# Patient Record
Sex: Female | Born: 1945 | Race: White | Hispanic: No | Marital: Married | Smoking: Former smoker
Health system: Southern US, Community
[De-identification: ages and names within clinical notes are randomized; demographics above are authoritative.]

## PROBLEM LIST (undated history)

## (undated) DIAGNOSIS — I2510 Atherosclerotic heart disease of native coronary artery without angina pectoris: Secondary | ICD-10-CM

## (undated) DIAGNOSIS — G629 Polyneuropathy, unspecified: Secondary | ICD-10-CM

## (undated) DIAGNOSIS — R296 Repeated falls: Secondary | ICD-10-CM

## (undated) DIAGNOSIS — D649 Anemia, unspecified: Secondary | ICD-10-CM

## (undated) DIAGNOSIS — E785 Hyperlipidemia, unspecified: Secondary | ICD-10-CM

## (undated) DIAGNOSIS — E119 Type 2 diabetes mellitus without complications: Secondary | ICD-10-CM

## (undated) DIAGNOSIS — I10 Essential (primary) hypertension: Secondary | ICD-10-CM

## (undated) DIAGNOSIS — E669 Obesity, unspecified: Secondary | ICD-10-CM

## (undated) DIAGNOSIS — W19XXXA Unspecified fall, initial encounter: Secondary | ICD-10-CM

## (undated) DIAGNOSIS — N189 Chronic kidney disease, unspecified: Secondary | ICD-10-CM

## (undated) HISTORY — PX: CARDIAC CATHETERIZATION: SHX172

---

## 2017-03-30 DIAGNOSIS — E119 Type 2 diabetes mellitus without complications: Secondary | ICD-10-CM | POA: Insufficient documentation

## 2017-03-30 DIAGNOSIS — Z794 Long term (current) use of insulin: Secondary | ICD-10-CM | POA: Insufficient documentation

## 2018-11-01 DIAGNOSIS — E1140 Type 2 diabetes mellitus with diabetic neuropathy, unspecified: Secondary | ICD-10-CM | POA: Insufficient documentation

## 2018-11-01 DIAGNOSIS — Z955 Presence of coronary angioplasty implant and graft: Secondary | ICD-10-CM | POA: Insufficient documentation

## 2018-11-01 DIAGNOSIS — I1310 Hypertensive heart and chronic kidney disease without heart failure, with stage 1 through stage 4 chronic kidney disease, or unspecified chronic kidney disease: Secondary | ICD-10-CM | POA: Insufficient documentation

## 2018-11-01 DIAGNOSIS — I739 Peripheral vascular disease, unspecified: Secondary | ICD-10-CM | POA: Insufficient documentation

## 2018-12-15 DIAGNOSIS — E782 Mixed hyperlipidemia: Secondary | ICD-10-CM | POA: Insufficient documentation

## 2020-12-26 ENCOUNTER — Emergency Department: Payer: Medicare Other

## 2020-12-26 ENCOUNTER — Observation Stay (HOSPITAL_COMMUNITY)
Admit: 2020-12-26 | Discharge: 2020-12-26 | Disposition: A | Discharge status: Home / Self Care | Payer: Medicare Other | Attending: Physician Assistant | Admitting: Physician Assistant

## 2020-12-26 ENCOUNTER — Other Ambulatory Visit: Payer: Self-pay

## 2020-12-26 ENCOUNTER — Inpatient Hospital Stay
Admission: EM | Admit: 2020-12-26 | Discharge: 2020-12-29 | DRG: 287 | Disposition: A | Discharge status: Home / Self Care | Payer: Medicare Other | Attending: Internal Medicine | Admitting: Internal Medicine

## 2020-12-26 ENCOUNTER — Encounter: Payer: Self-pay | Admitting: Internal Medicine

## 2020-12-26 DIAGNOSIS — Z803 Family history of malignant neoplasm of breast: Secondary | ICD-10-CM

## 2020-12-26 DIAGNOSIS — Z79899 Other long term (current) drug therapy: Secondary | ICD-10-CM

## 2020-12-26 DIAGNOSIS — I480 Paroxysmal atrial fibrillation: Secondary | ICD-10-CM | POA: Diagnosis not present

## 2020-12-26 DIAGNOSIS — I129 Hypertensive chronic kidney disease with stage 1 through stage 4 chronic kidney disease, or unspecified chronic kidney disease: Secondary | ICD-10-CM | POA: Diagnosis present

## 2020-12-26 DIAGNOSIS — I10 Essential (primary) hypertension: Secondary | ICD-10-CM | POA: Diagnosis not present

## 2020-12-26 DIAGNOSIS — R079 Chest pain, unspecified: Secondary | ICD-10-CM | POA: Diagnosis not present

## 2020-12-26 DIAGNOSIS — Z955 Presence of coronary angioplasty implant and graft: Secondary | ICD-10-CM

## 2020-12-26 DIAGNOSIS — I4891 Unspecified atrial fibrillation: Secondary | ICD-10-CM | POA: Diagnosis not present

## 2020-12-26 DIAGNOSIS — E1140 Type 2 diabetes mellitus with diabetic neuropathy, unspecified: Secondary | ICD-10-CM

## 2020-12-26 DIAGNOSIS — Z823 Family history of stroke: Secondary | ICD-10-CM

## 2020-12-26 DIAGNOSIS — Z20822 Contact with and (suspected) exposure to covid-19: Secondary | ICD-10-CM | POA: Diagnosis present

## 2020-12-26 DIAGNOSIS — D649 Anemia, unspecified: Secondary | ICD-10-CM

## 2020-12-26 DIAGNOSIS — Z885 Allergy status to narcotic agent status: Secondary | ICD-10-CM

## 2020-12-26 DIAGNOSIS — I4510 Unspecified right bundle-branch block: Secondary | ICD-10-CM | POA: Diagnosis present

## 2020-12-26 DIAGNOSIS — Z888 Allergy status to other drugs, medicaments and biological substances status: Secondary | ICD-10-CM

## 2020-12-26 DIAGNOSIS — I214 Non-ST elevation (NSTEMI) myocardial infarction: Secondary | ICD-10-CM | POA: Diagnosis present

## 2020-12-26 DIAGNOSIS — E1122 Type 2 diabetes mellitus with diabetic chronic kidney disease: Secondary | ICD-10-CM | POA: Diagnosis present

## 2020-12-26 DIAGNOSIS — R778 Other specified abnormalities of plasma proteins: Secondary | ICD-10-CM | POA: Diagnosis not present

## 2020-12-26 DIAGNOSIS — Z820 Family history of epilepsy and other diseases of the nervous system: Secondary | ICD-10-CM

## 2020-12-26 DIAGNOSIS — G4730 Sleep apnea, unspecified: Secondary | ICD-10-CM | POA: Diagnosis present

## 2020-12-26 DIAGNOSIS — I2510 Atherosclerotic heart disease of native coronary artery without angina pectoris: Secondary | ICD-10-CM | POA: Diagnosis present

## 2020-12-26 DIAGNOSIS — Z8049 Family history of malignant neoplasm of other genital organs: Secondary | ICD-10-CM

## 2020-12-26 DIAGNOSIS — I2584 Coronary atherosclerosis due to calcified coronary lesion: Secondary | ICD-10-CM | POA: Diagnosis present

## 2020-12-26 DIAGNOSIS — Z6841 Body Mass Index (BMI) 40.0 and over, adult: Secondary | ICD-10-CM

## 2020-12-26 DIAGNOSIS — E669 Obesity, unspecified: Secondary | ICD-10-CM

## 2020-12-26 DIAGNOSIS — N183 Chronic kidney disease, stage 3 unspecified: Secondary | ICD-10-CM | POA: Diagnosis present

## 2020-12-26 DIAGNOSIS — E1142 Type 2 diabetes mellitus with diabetic polyneuropathy: Secondary | ICD-10-CM | POA: Diagnosis present

## 2020-12-26 DIAGNOSIS — E785 Hyperlipidemia, unspecified: Secondary | ICD-10-CM

## 2020-12-26 DIAGNOSIS — D631 Anemia in chronic kidney disease: Secondary | ICD-10-CM | POA: Diagnosis present

## 2020-12-26 DIAGNOSIS — Z8249 Family history of ischemic heart disease and other diseases of the circulatory system: Secondary | ICD-10-CM

## 2020-12-26 DIAGNOSIS — F419 Anxiety disorder, unspecified: Secondary | ICD-10-CM | POA: Diagnosis present

## 2020-12-26 DIAGNOSIS — I252 Old myocardial infarction: Secondary | ICD-10-CM

## 2020-12-26 DIAGNOSIS — Z87891 Personal history of nicotine dependence: Secondary | ICD-10-CM

## 2020-12-26 DIAGNOSIS — I248 Other forms of acute ischemic heart disease: Secondary | ICD-10-CM | POA: Diagnosis present

## 2020-12-26 HISTORY — DX: Hyperlipidemia, unspecified: E78.5

## 2020-12-26 HISTORY — DX: Chronic kidney disease, unspecified: N18.9

## 2020-12-26 HISTORY — DX: Obesity, unspecified: E66.9

## 2020-12-26 HISTORY — DX: Anemia, unspecified: D64.9

## 2020-12-26 HISTORY — DX: Essential (primary) hypertension: I10

## 2020-12-26 HISTORY — DX: Atherosclerotic heart disease of native coronary artery without angina pectoris: I25.10

## 2020-12-26 HISTORY — DX: Repeated falls: R29.6

## 2020-12-26 HISTORY — DX: Polyneuropathy, unspecified: G62.9

## 2020-12-26 HISTORY — DX: Type 2 diabetes mellitus without complications: E11.9

## 2020-12-26 HISTORY — DX: Unspecified fall, initial encounter: W19.XXXA

## 2020-12-26 LAB — CBC
HCT: 32.8 % — ABNORMAL LOW (ref 36.0–46.0)
Hemoglobin: 11.1 g/dL — ABNORMAL LOW (ref 12.0–15.0)
MCH: 31.4 pg (ref 26.0–34.0)
MCHC: 33.8 g/dL (ref 30.0–36.0)
MCV: 92.9 fL (ref 80.0–100.0)
Platelets: 229 10*3/uL (ref 150–400)
RBC: 3.53 MIL/uL — ABNORMAL LOW (ref 3.87–5.11)
RDW: 13.9 % (ref 11.5–15.5)
WBC: 8.8 10*3/uL (ref 4.0–10.5)
nRBC: 0 % (ref 0.0–0.2)

## 2020-12-26 LAB — COMPREHENSIVE METABOLIC PANEL
ALT: 15 U/L (ref 0–44)
AST: 26 U/L (ref 15–41)
Albumin: 3.8 g/dL (ref 3.5–5.0)
Alkaline Phosphatase: 74 U/L (ref 38–126)
Anion gap: 12 (ref 5–15)
BUN: 51 mg/dL — ABNORMAL HIGH (ref 8–23)
CO2: 21 mmol/L — ABNORMAL LOW (ref 22–32)
Calcium: 9.3 mg/dL (ref 8.9–10.3)
Chloride: 107 mmol/L (ref 98–111)
Creatinine, Ser: 1.65 mg/dL — ABNORMAL HIGH (ref 0.44–1.00)
GFR, Estimated: 32 mL/min — ABNORMAL LOW (ref >60)
Glucose, Bld: 195 mg/dL — ABNORMAL HIGH (ref 70–99)
Potassium: 4.1 mmol/L (ref 3.5–5.1)
Sodium: 140 mmol/L (ref 135–145)
Total Bilirubin: 0.7 mg/dL (ref 0.3–1.2)
Total Protein: 7.6 g/dL (ref 6.5–8.1)

## 2020-12-26 LAB — ECHOCARDIOGRAM COMPLETE
AR max vel: 2.08 cm2
AV Area VTI: 2.03 cm2
AV Area mean vel: 1.86 cm2
AV Mean grad: 5 mmHg
AV Peak grad: 8.8 mmHg
Ao pk vel: 1.49 m/s
Area-P 1/2: 5.58 cm2
Height: 63 in
S' Lateral: 3.39 cm
Weight: 3840 oz

## 2020-12-26 LAB — TSH: TSH: 1.571 u[IU]/mL (ref 0.350–4.500)

## 2020-12-26 LAB — RESP PANEL BY RT-PCR (FLU A&B, COVID) ARPGX2
Influenza A by PCR: NEGATIVE
Influenza B by PCR: NEGATIVE
SARS Coronavirus 2 by RT PCR: NEGATIVE

## 2020-12-26 LAB — APTT: aPTT: 29 seconds (ref 24–36)

## 2020-12-26 LAB — TROPONIN I (HIGH SENSITIVITY)
Troponin I (High Sensitivity): 24 ng/L — ABNORMAL HIGH (ref <18)
Troponin I (High Sensitivity): 420 ng/L (ref <18)
Troponin I (High Sensitivity): 1311 ng/L (ref <18)
Troponin I (High Sensitivity): 2199 ng/L (ref <18)

## 2020-12-26 LAB — HEPARIN LEVEL (UNFRACTIONATED): Heparin Unfractionated: <0.1 IU/mL — ABNORMAL LOW (ref 0.30–0.70)

## 2020-12-26 LAB — HEMOGLOBIN A1C
Hgb A1c MFr Bld: 6.7 % — ABNORMAL HIGH (ref 4.8–5.6)
Mean Plasma Glucose: 145.59 mg/dL

## 2020-12-26 LAB — MAGNESIUM: Magnesium: 2 mg/dL (ref 1.7–2.4)

## 2020-12-26 LAB — PROTIME-INR
INR: 1 (ref 0.8–1.2)
Prothrombin Time: 13 seconds (ref 11.4–15.2)

## 2020-12-26 LAB — CBG MONITORING, ED
Glucose-Capillary: 143 mg/dL — ABNORMAL HIGH (ref 70–99)
Glucose-Capillary: 104 mg/dL — ABNORMAL HIGH (ref 70–99)

## 2020-12-26 LAB — GLUCOSE, CAPILLARY: Glucose-Capillary: 136 mg/dL — ABNORMAL HIGH (ref 70–99)

## 2020-12-26 LAB — T4, FREE: Free T4: 0.9 ng/dL (ref 0.61–1.12)

## 2020-12-26 MED ORDER — ATORVASTATIN CALCIUM 20 MG PO TABS: 40.0000 mg | ORAL_TABLET | Freq: Every day | ORAL | Status: DC

## 2020-12-26 MED ORDER — DILTIAZEM HCL 25 MG/5ML IV SOLN
15.0000 mg | Freq: Once | INTRAVENOUS | Status: AC
  Administered 2020-12-26: 15 mg via INTRAVENOUS
  Filled 2020-12-26: qty 5

## 2020-12-26 MED ORDER — ASPIRIN 81 MG PO CHEW: 324.0000 mg | CHEWABLE_TABLET | ORAL | Status: AC

## 2020-12-26 MED ORDER — SODIUM CHLORIDE 0.9% FLUSH: 3.0000 mL | INTRAVENOUS | Status: DC | PRN

## 2020-12-26 MED ORDER — HEPARIN BOLUS VIA INFUSION
2300.0000 [IU] | Freq: Once | INTRAVENOUS | Status: AC
  Administered 2020-12-26: 2300 [IU] via INTRAVENOUS
  Filled 2020-12-26: qty 2300

## 2020-12-26 MED ORDER — DILTIAZEM HCL-DEXTROSE 125-5 MG/125ML-% IV SOLN (PREMIX): 5.0000 mg/h | INTRAVENOUS | Status: DC

## 2020-12-26 MED ORDER — CLOPIDOGREL BISULFATE 75 MG PO TABS
75.0000 mg | ORAL_TABLET | Freq: Every day | ORAL | Status: DC
  Administered 2020-12-27 – 2020-12-29 (×3): 75 mg via ORAL
  Filled 2020-12-26 (×3): qty 1

## 2020-12-26 MED ORDER — ACETAMINOPHEN 325 MG PO TABS
650.0000 mg | ORAL_TABLET | ORAL | Status: DC | PRN
  Administered 2020-12-27 – 2020-12-29 (×2): 650 mg via ORAL
  Filled 2020-12-26 (×2): qty 2

## 2020-12-26 MED ORDER — ASPIRIN 300 MG RE SUPP: 300.0000 mg | RECTAL | Status: AC

## 2020-12-26 MED ORDER — ASPIRIN 81 MG PO CHEW: 324.0000 mg | CHEWABLE_TABLET | Freq: Once | ORAL | Status: DC

## 2020-12-26 MED ORDER — METOPROLOL TARTRATE 25 MG PO TABS: 25.0000 mg | ORAL_TABLET | Freq: Two times a day (BID) | ORAL | Status: DC

## 2020-12-26 MED ORDER — SODIUM CHLORIDE 0.9 % IV SOLN: 250.0000 mL | INTRAVENOUS | Status: DC | PRN

## 2020-12-26 MED ORDER — SODIUM CHLORIDE 0.9 % IV BOLUS
1000.0000 mL | Freq: Once | INTRAVENOUS | Status: AC
  Administered 2020-12-26: 1000 mL via INTRAVENOUS

## 2020-12-26 MED ORDER — ASPIRIN 81 MG PO CHEW
81.0000 mg | CHEWABLE_TABLET | Freq: Once | ORAL | Status: AC
  Administered 2020-12-26: 81 mg via ORAL
  Filled 2020-12-26: qty 1

## 2020-12-26 MED ORDER — LOSARTAN POTASSIUM 50 MG PO TABS
50.0000 mg | ORAL_TABLET | Freq: Every day | ORAL | Status: DC
  Administered 2020-12-27 – 2020-12-29 (×3): 50 mg via ORAL
  Filled 2020-12-26 (×3): qty 1

## 2020-12-26 MED ORDER — METOPROLOL SUCCINATE ER 50 MG PO TB24
50.0000 mg | ORAL_TABLET | Freq: Every day | ORAL | Status: DC
  Administered 2020-12-27 – 2020-12-28 (×2): 50 mg via ORAL
  Filled 2020-12-26 (×2): qty 1

## 2020-12-26 MED ORDER — ASPIRIN EC 81 MG PO TBEC
81.0000 mg | DELAYED_RELEASE_TABLET | Freq: Every day | ORAL | Status: DC
  Administered 2020-12-27 – 2020-12-29 (×3): 81 mg via ORAL
  Filled 2020-12-26 (×3): qty 1

## 2020-12-26 MED ORDER — AMLODIPINE BESYLATE 5 MG PO TABS
5.0000 mg | ORAL_TABLET | Freq: Every day | ORAL | Status: DC
  Administered 2020-12-27: 5 mg via ORAL
  Filled 2020-12-26 (×2): qty 1

## 2020-12-26 MED ORDER — NITROGLYCERIN 0.4 MG SL SUBL
0.4000 mg | SUBLINGUAL_TABLET | SUBLINGUAL | Status: DC | PRN
Start: 2020-12-26 — End: 2020-12-26

## 2020-12-26 MED ORDER — SODIUM CHLORIDE 0.9% FLUSH
3.0000 mL | Freq: Two times a day (BID) | INTRAVENOUS | Status: DC
  Administered 2020-12-26 – 2020-12-28 (×5): 3 mL via INTRAVENOUS

## 2020-12-26 MED ORDER — INSULIN ASPART 100 UNIT/ML IJ SOLN
0.0000 [IU] | Freq: Three times a day (TID) | INTRAMUSCULAR | Status: DC
Start: 2020-12-26 — End: 2020-12-29
  Administered 2020-12-27: 3 [IU] via SUBCUTANEOUS
  Administered 2020-12-28: 4 [IU] via SUBCUTANEOUS
  Administered 2020-12-28: 3 [IU] via SUBCUTANEOUS
  Administered 2020-12-29 (×2): 4 [IU] via SUBCUTANEOUS
  Filled 2020-12-26 (×5): qty 1

## 2020-12-26 MED ORDER — FUROSEMIDE 40 MG PO TABS
40.0000 mg | ORAL_TABLET | Freq: Every day | ORAL | Status: DC
  Administered 2020-12-27: 40 mg via ORAL
  Filled 2020-12-26: qty 1

## 2020-12-26 MED ORDER — NITROGLYCERIN 0.4 MG SL SUBL: 0.4000 mg | SUBLINGUAL_TABLET | SUBLINGUAL | Status: DC | PRN

## 2020-12-26 MED ORDER — ONDANSETRON HCL 4 MG/2ML IJ SOLN: 4.0000 mg | Freq: Four times a day (QID) | INTRAMUSCULAR | Status: DC | PRN

## 2020-12-26 MED ORDER — HEPARIN BOLUS VIA INFUSION
4000.0000 [IU] | Freq: Once | INTRAVENOUS | Status: AC
  Administered 2020-12-26: 4000 [IU] via INTRAVENOUS
  Filled 2020-12-26: qty 4000

## 2020-12-26 MED ORDER — HEPARIN (PORCINE) 25000 UT/250ML-% IV SOLN
1700.0000 [IU]/h | INTRAVENOUS | Status: DC
  Administered 2020-12-26: 950 [IU]/h via INTRAVENOUS
  Administered 2020-12-27: 1700 [IU]/h via INTRAVENOUS
  Administered 2020-12-27: 1400 [IU]/h via INTRAVENOUS
  Administered 2020-12-28 – 2020-12-29 (×2): 1700 [IU]/h via INTRAVENOUS
  Filled 2020-12-26 (×5): qty 250

## 2020-12-26 MED ORDER — NITROGLYCERIN 0.2 MG/HR TD PT24
0.2000 mg | MEDICATED_PATCH | Freq: Every day | TRANSDERMAL | Status: DC
  Administered 2020-12-26 – 2020-12-29 (×4): 0.2 mg via TRANSDERMAL
  Filled 2020-12-26 (×4): qty 1

## 2020-12-26 NOTE — ED Triage Notes (Signed)
Pt in with co chest tightness and burning that started at 0000. States woke her up, took 81 mg ASA x 3 and 4 nitro since. States had MI 20 years ago. States nitro did decrease pain.

## 2020-12-26 NOTE — ED Notes (Signed)
Assisted Pt to toilet.   Echo Tech at bedside.

## 2020-12-26 NOTE — ED Notes (Signed)
ED Provider at bedside. 

## 2020-12-26 NOTE — ED Notes (Signed)
Report to Crystal, RN.

## 2020-12-26 NOTE — Progress Notes (Signed)
*  PRELIMINARY RESULTS* Echocardiogram 2D Echocardiogram has been performed.  Hannah Thomas 12/26/2020, 2:21 PM 

## 2020-12-26 NOTE — Progress Notes (Signed)
   12/26/20 1610  Clinical Encounter Type  Visited With Family  Visit Type Initial;Social support;Spiritual support  Spiritual Encounters  Spiritual Needs Other (Comment) (social support)  Luna Fuse engaged Pt's spouse in hallway. He appeared to be rather restless but was not anxious. Chaplain Burris offered reflective listening and social support, encouraging spouse in his self-care. Son arrived. Spouse informed chaplain that Pt would have a heart cath procedure and Luna Fuse stated that she would plan to follow-up with them on Monday, 8/8.

## 2020-12-26 NOTE — ED Notes (Signed)
Placed pt in hospital bed and she went to the toilet to urinate during change over.

## 2020-12-26 NOTE — ED Notes (Signed)
Pharmacy at bedside

## 2020-12-26 NOTE — ED Notes (Signed)
PA from cardiology at bedside.

## 2020-12-26 NOTE — ED Notes (Signed)
xt at bedside.

## 2020-12-26 NOTE — Plan of Care (Signed)

## 2020-12-26 NOTE — Consult Note (Signed)
ANTICOAGULATION CONSULT NOTE - Initial Consult  Pharmacy Consult for heparin initiation  Indication: chest pain/ACS and new onset AFib  Allergies  Allergen Reactions   Erythromycin Other (See Comments)   Morphine And Related Rash    Patient Measurements: Height: 5\' 3"  (160 cm) Weight: 108.9 kg (240 lb) IBW/kg (Calculated) : 52.4 Heparin Dosing Weight: 78.5kg  Vital Signs: Temp: 98.4 F (36.9 C) (08/05 0619) Temp Source: Oral (08/05 0619) BP: 161/64 (08/05 0830) Pulse Rate: 83 (08/05 0830)  Labs: Recent Labs    12/26/20 0634 12/26/20 0826  HGB 11.1*  --   HCT 32.8*  --   PLT 229  --   CREATININE 1.65*  --   TROPONINIHS 24* 420*    Estimated Creatinine Clearance: 33.8 mL/min (A) (by C-G formula based on SCr of 1.65 mg/dL (H)).   Medical History: No past medical history on file.  Medications:  (Not in a hospital admission)  Scheduled:   heparin  4,000 Units Intravenous Once   Infusions:   heparin     PRN:  Anti-infectives (From admission, onward)    None       Assessment: Ms. Bauer is a 75yo female presenting with chest pain (Troponin trend: 24>420>1311), new onset atrial fibrilation (CHA2DS2-VASc=6), and rapid ventricular rate. Cardiology is following. Pharmacy was consulted for heparin initiation for ACS and new onset AFib.  After reviewing the baseline CBC, no contraindications for heparin are present.   The PTA medication list does not include a DOAC.  Goal of Therapy:  Heparin level 0.3-0.7 units/ml aPTT 66-102 seconds Monitor platelets by anticoagulation protocol: Yes   Plan:  Give 4000 units bolus x 1 Start heparin infusion at 1,100 units/hr Check anti-Xa level in 8 hours and daily while on heparin Continue to monitor H&H and platelets  03-12-1979, PharmD Pharmacy Resident  12/26/2020 9:44 AM

## 2020-12-26 NOTE — H&P (Addendum)
History and Physical    Hannah Thomas GBT:[id number] DOB: [birth year] DOA: 12/26/2020  PCP: Pcp, No   Patient coming from: Home  I have personally briefly reviewed patient's old medical records in [facility]  Chief Complaint: Chest pain  HPI: Hannah Thomas is a 75 y.o. female with medical history significant for coronary artery disease status post PCI with stent angioplasty, hypertension, diabetes mellitus, dyslipidemia who presents to the emergency room for evaluation of chest pain. She presented for evaluation of midsternal chest pain that she described as pressure-like and a burning sensation that woke her up at about midnight.  She took 2 nitroglycerin pills with improvement in her symptoms and was able to go back to bed.  She awoke again at 5 AM with worsening chest pain and took 4 nitroglycerin tablets 5 minutes apart without any significant improvement in her symptoms.  Chest pain was nonradiating and was associated with diaphoresis and palpitations.  She denied having any nausea, no vomiting or shortness of breath.  Due to the persistence of her symptoms she decided to come to the emergency room to get checked. At the time of my evaluation she is chest pain-free. She denies having any cough, no headache, no fever, no chills, no abdominal pain, no urinary symptoms, no lower extremity swelling, no dizziness or lightheadedness. Labs show sodium 140, potassium 4.1, chloride 107, bicarb 21, glucose 195, BUN 51, creatinine 1.65, calcium 9.3, magnesium 2.0, alkaline phosphatase 74, albumin 3.8, AST 26, ALT 15, total protein 7.6, troponin 24 >> 420, white count 8.8, hemoglobin 11.1, hematocrit 32.8, MCV 92.9, RDW 13.9, platelet count 229, PT 13.0, INR 1.0, TSH 1.57 Chest x-ray reviewed by me shows low lung volumes without evidence of acute cardiopulmonary disease. Respiratory viral panel is negative Twelve-lead EKG showed rapid atrial fibrillation with a right bundle branch block  ED Course: Patient  is a 75 year old female with a history of coronary artery disease status post stent angioplasty.  Last stent was placed in March, 2022.  She presents to the ER for evaluation of midsternal chest pain similar to her prior history of heart attacks and was found to be in rapid A. fib. She does not have a known history of atrial fibrillation. She received a dose of Cardizem 15 mg IV once and prior to initiation of Cardizem drip she converted to sinus rhythm. Her initial troponin was 24 and second troponin resulted at 420. She will be referred to observation status for further evaluation.  Review of Systems: As per HPI otherwise all other systems reviewed and negative.    Past Medical History:  Diagnosis Date   Anemia    CAD in native artery    a. MI ~ 2000 s/p PCI x 2; b. PCI ~ 2015; c. PCI ~ 2020 complicated vy contrast-induced nephropathy; d. PCI 07/2020   CKD (chronic kidney disease)    DM2 (diabetes mellitus, type 2) (HCC)    Essential hypertension    Falls    Hyperlipidemia LDL goal <70    Obesity    Peripheral neuropathy     Past Surgical History:  Procedure Laterality Date   CARDIAC CATHETERIZATION       reports that she quit smoking about 45 years ago. Her smoking use included cigarettes. She started smoking about 57 years ago. She has never used smokeless tobacco. She reports previous alcohol use. No history on file for drug use.  Allergies  Allergen Reactions   Erythromycin Other (See Comments)   Morphine And Related  Rash    Family History  Problem Relation Age of Onset   Breast cancer Mother    CAD Mother    Hypertension Mother    Migraines Mother    Stroke Mother    Dementia Father    Parkinson's disease Father    Endometrial cancer Sister       Prior to Admission medications   Not on File    Physical Exam: Vitals:   12/26/20 0830 12/26/20 0900 12/26/20 1000 12/26/20 1030  BP: (!) 161/64 (!) 161/53 (!) 182/67 (!) 157/50  Pulse: 83 81 84 91  Resp: (!)  29 (!) 21 18 12   Temp:      TempSrc:      SpO2: 97% 98% 97% 97%  Weight:      Height:         Vitals:   12/26/20 0830 12/26/20 0900 12/26/20 1000 12/26/20 1030  BP: (!) 161/64 (!) 161/53 (!) 182/67 (!) 157/50  Pulse: 83 81 84 91  Resp: (!) 29 (!) 21 18 12   Temp:      TempSrc:      SpO2: 97% 98% 97% 97%  Weight:      Height:          Constitutional: Alert and oriented x 3 . Not in any apparent distress HEENT:      Head: Normocephalic and atraumatic.         Eyes: PERLA, EOMI, Conjunctivae are normal. Sclera is non-icteric.       Mouth/Throat: Mucous membranes are moist.       Neck: Supple with no signs of meningismus. Cardiovascular: Regular rate and rhythm. No murmurs, gallops, or rubs. 2+ symmetrical distal pulses are present . No JVD. Trace LE edema Respiratory: Respiratory effort normal .Lungs sounds clear bilaterally. No wheezes, crackles, or rhonchi.  Gastrointestinal: Soft, non tender, and non distended with positive bowel sounds.  Genitourinary: No CVA tenderness. Musculoskeletal: Nontender with normal range of motion in all extremities. No cyanosis, or erythema of extremities. Neurologic:  Face is symmetric. Moving all extremities. No gross focal neurologic deficits . Skin: Skin is warm, dry.  No rash or ulcers Psychiatric: Mood and affect are normal    Labs on Admission: I have personally reviewed following labs and imaging studies  CBC: Recent Labs  Lab 12/26/20 0634  WBC 8.8  HGB 11.1*  HCT 32.8*  MCV 92.9  PLT 229   Basic Metabolic Panel: Recent Labs  Lab 12/26/20 0634 12/26/20 0826  NA 140  --   K 4.1  --   CL 107  --   CO2 21*  --   GLUCOSE 195*  --   BUN 51*  --   CREATININE 1.65*  --   CALCIUM 9.3  --   MG  --  2.0   GFR: Estimated Creatinine Clearance: 33.8 mL/min (A) (by C-G formula based on SCr of 1.65 mg/dL (H)). Liver Function Tests: Recent Labs  Lab 12/26/20 0634  AST 26  ALT 15  ALKPHOS 74  BILITOT 0.7  PROT 7.6   ALBUMIN 3.8   No results for input(s): LIPASE, AMYLASE in the last 168 hours. No results for input(s): AMMONIA in the last 168 hours. Coagulation Profile: Recent Labs  Lab 12/26/20 0922  INR 1.0   Cardiac Enzymes: No results for input(s): CKTOTAL, CKMB, CKMBINDEX, TROPONINI in the last 168 hours. BNP (last 3 results) No results for input(s): PROBNP in the last 8760 hours. HbA1C: No results for input(s): HGBA1C in the  last 72 hours. CBG: No results for input(s): GLUCAP in the last 168 hours. Lipid Profile: No results for input(s): CHOL, HDL, LDLCALC, TRIG, CHOLHDL, LDLDIRECT in the last 72 hours. Thyroid Function Tests: Recent Labs    12/26/20 0634  TSH 1.571  FREET4 0.90   Anemia Panel: No results for input(s): VITAMINB12, FOLATE, FERRITIN, TIBC, IRON, RETICCTPCT in the last 72 hours. Urine analysis: No results found for: COLORURINE, APPEARANCEUR, LABSPEC, PHURINE, GLUCOSEU, HGBUR, BILIRUBINUR, KETONESUR, PROTEINUR, UROBILINOGEN, NITRITE, LEUKOCYTESUR  Radiological Exams on Admission: DG Chest Port 1 View  Result Date: 12/26/2020 CLINICAL DATA:  75 year old female with history of chest tightness and burning. EXAM: PORTABLE CHEST 1 VIEW COMPARISON:  No priors. FINDINGS: Lung volumes are slightly low. Linear areas of scarring or subsegmental atelectasis are noted throughout the left mid to lower lung. No acute consolidative airspace disease. No pleural effusions. No pneumothorax. No evidence of pulmonary edema. Heart size is borderline enlarged accentuated by low lung volumes and portable AP technique. Upper mediastinal contours are within normal limits. Atherosclerotic calcifications in the arch of the aorta. IMPRESSION: 1. Low lung volumes without definite radiographic evidence of acute cardiopulmonary disease. 2. Mild linear scarring or subsegmental atelectasis in the left mid to lower lung. 3. Aortic atherosclerosis. Electronically Signed   By: Trudie Reed M.D.   On:  12/26/2020 07:04     Assessment/Plan Principal Problem:   Atrial fibrillation with rapid ventricular response (HCC) Active Problems:   CAD in native artery   CKD stage 3 due to type 2 diabetes mellitus (HCC)   Obesity   Essential hypertension   NSTEMI (non-ST elevated myocardial infarction) (HCC)      Atrial fibrillation with rapid ventricular response (Paroxysmal) Patient presented to the ER for evaluation of chest pain and was found to be in rapid atrial fibrillation. Received a dose of Cardizem 15 mg IV x1 and converted back to sinus rhythm Her CHA2DS2-VASc score is 5 and ideally she requires anticoagulation as primary prophylaxis for an acute stroke. Will start patient on a heparin drip Continue beta-blockers for rate control Obtain 2D echocardiogram to assess LVEF and rule out valvular pathology    Non-ST elevation MI Patient has a history of coronary artery disease and is status post stent angioplasty She presents to the ER for evaluation of chest pain that woke her up out of her sleep with minimal response to nitroglycerin and similar to her prior episodes of heart attack. She bumped her troponin from 24 to 420 Increase in troponin may also be from demand ischemia related to tachycardia versus from known coronary artery disease Continue heparin drip Continue aspirin, Plavix, statins and beta-blockers Obtain 2D echocardiogram to assess LVEF and rule out regional wall motion abnormality Cycle serial cardiac enzymes Consult cardiology    Diabetes mellitus with complications of stage III chronic kidney disease Place patient on a consistent carbohydrate diet Glycemic control with sliding scale insulin    Hypertension Continue metoprolol, amlodipine and Cozaar    Morbid obesity (BMI 42) Complicates overall prognosis and care    DVT prophylaxis: Heparin Code Status: full code  Family Communication: Greater than 50% of time was spent discussing patient's  condition and plan of care with her at the bedside.  All questions and concerns have been addressed.  She verbalizes understanding and agrees with the plan. Disposition Plan: Back to previous home environment Consults called: Cardiology Status: Observation    Charlee Squibb MD [facility]     12/26/2020, 11:00 AM

## 2020-12-26 NOTE — ED Notes (Signed)
Assisted pt to bathroom with steady gait, pt had large bowel movement. Night time home meds reviewed and not ordered, pt taking own night meds (60units lantus, 1000mg  metformin, 5mg  amlodipine, 50mg  metoprolol)

## 2020-12-26 NOTE — ED Provider Notes (Signed)
[facility] Emergency Department Provider Note   ____________________________________________   Event Date/Time   First MD Initiated Contact with Patient 12/26/20 0630     (approximate)  I have reviewed the triage vital signs and the nursing notes.   HISTORY  Chief Complaint Chest Pain    HPI Hannah Thomas is a 75 y.o. female who presents to the ED from home with a chief complaint of chest pain.  States chest tightness and burning woke her up after midnight.  Patient took 3 baby aspirin and 4 nitroglycerin.  Remote history of MI.  Reports partial relief and chest tightness, some shortness of breath.  Denies recent fevers, cough, abdominal pain, nausea, vomiting or dizziness.  Patient is vaccinated and boosted against COVID-19.  Splits her time between [place] and [place]; putting her house up for sale so has been under extra stress recently.  She is supposed to show her house today.     Past medical history CAD  There are no problems to display for this patient.   Prior to Admission medications   Not on File    Allergies Erythromycin and Morphine and related  No family history on file.  Social History Denies tobacco  Review of Systems  Constitutional: No fever/chills Eyes: No visual changes. ENT: No sore throat. Cardiovascular: Positive for chest pain. Respiratory: Positive for shortness of breath. Gastrointestinal: No abdominal pain.  No nausea, no vomiting.  No diarrhea.  No constipation. Genitourinary: Negative for dysuria. Musculoskeletal: Negative for back pain. Skin: Negative for rash. Neurological: Negative for headaches, focal weakness or numbness.   ____________________________________________   PHYSICAL EXAM:  VITAL SIGNS: ED Triage Vitals  Enc Vitals Group     BP 12/26/20 0619 (!) 161/148     Pulse Rate 12/26/20 0619 (!) 142     Resp 12/26/20 0619 18     Temp 12/26/20 0619 98.4 F (36.9 C)     Temp Source 12/26/20  0619 Oral     SpO2 12/26/20 0619 99 %     Weight 12/26/20 0616 240 lb (108.9 kg)     Height 12/26/20 0616 5\' 3"  (1.6 m)     Head Circumference --      Peak Flow --      Pain Score 12/26/20 0616 2     Pain Loc --      Pain Edu? --      Excl. in GC? --     Constitutional: Alert and oriented.  Elderly appearing and in mild acute distress. Eyes: Conjunctivae are normal. PERRL. EOMI. Head: Atraumatic. Nose: No congestion/rhinnorhea. Mouth/Throat: Mucous membranes are moist.   Neck: No stridor.  No thyromegaly. Cardiovascular: Tachycardic rate, irregular rhythm. Grossly normal heart sounds.  Good peripheral circulation. Respiratory: Normal respiratory effort.  No retractions. Lungs CTAB. Gastrointestinal: Soft and nontender to light or deep palpation. No distention. No abdominal bruits. No CVA tenderness. Musculoskeletal: No lower extremity tenderness nor edema.  No joint effusions. Neurologic:  Normal speech and language. No gross focal neurologic deficits are appreciated.  Skin:  Skin is warm, dry and intact. No rash noted. Psychiatric: Mood and affect are normal. Speech and behavior are normal.  ____________________________________________   LABS (all labs ordered are listed, but only abnormal results are displayed)  Labs Reviewed  CBC - Abnormal; Notable for the following components:      Result Value   RBC 3.53 (*)    Hemoglobin 11.1 (*)    HCT 32.8 (*)    All  other components within normal limits  RESP PANEL BY RT-PCR (FLU A&B, COVID) ARPGX2  COMPREHENSIVE METABOLIC PANEL  TSH  T4, FREE  TROPONIN I (HIGH SENSITIVITY)   ____________________________________________  EKG  ED ECG REPORT I, Cleopha Indelicato J, the attending physician, personally viewed and interpreted this ECG.   Date: 12/26/2020  EKG Time: 0617  Rate: 150  Rhythm: atrial fibrillation, rate 150  Axis: LAD  Intervals:left anterior fascicular block  ST&T Change:  Nonspecific  ____________________________________________  RADIOLOGY Tawni Millers J, personally viewed and evaluated these images (plain radiographs) as part of my medical decision making, as well as reviewing the written report by the radiologist.  ED MD interpretation: Pending  Official radiology report(s): No results found.  ____________________________________________   PROCEDURES  Procedure(s) performed (including Critical Care):  .1-3 Lead EKG Interpretation  Date/Time: 12/26/2020 6:44 AM Performed by: Irean Hong, MD Authorized by: Irean Hong, MD     Interpretation: abnormal     ECG rate:  138   ECG rate assessment: tachycardic     Rhythm: atrial fibrillation     Ectopy: none     Conduction: normal   Comments:     Patient placed on cardiac monitor to evaluate for arrhythmias  CRITICAL CARE Performed by: Irean Hong   Total critical care time: 20 minutes  Critical care time was exclusive of separately billable procedures and treating other patients.  Critical care was necessary to treat or prevent imminent or life-threatening deterioration.  Critical care was time spent personally by me on the following activities: development of treatment plan with patient and/or surrogate as well as nursing, discussions with consultants, evaluation of patient's response to treatment, examination of patient, obtaining history from patient or surrogate, ordering and performing treatments and interventions, ordering and review of laboratory studies, ordering and review of radiographic studies, pulse oximetry and re-evaluation of patient's condition.  ____________________________________________   INITIAL IMPRESSION / ASSESSMENT AND PLAN / ED COURSE  As part of my medical decision making, I reviewed the following data within the electronic MEDICAL RECORD NUMBER Nursing notes reviewed and incorporated, Labs reviewed, EKG interpreted, Old chart reviewed, Patient signed out to Dr.  Cyril Loosen, Radiograph reviewed, and Notes from prior ED visits     75 year old female presenting with chest pain, new onset atrial fibrillation and rapid ventricular rate Differential diagnosis includes, but is not limited to, ACS, aortic dissection, pulmonary embolism, cardiac tamponade, pneumothorax, pneumonia, pericarditis, myocarditis, GI-related causes including esophagitis/gastritis, and musculoskeletal chest wall pain.     Will obtain cardiac panel, chest x-ray, COVID swab.  Patient is hemodynamically stable; will administer IV Cardizem bolus for rate control.  Clinical Course as of 12/26/20 0701  Fri Dec 26, 2020  0700 Patient transferred to Dr. Cyril Loosen pending labs, cxr, reassessment.  Anticipate hospitalization for new onset atrial fibrillation. [JS]    Clinical Course User Index [JS] Irean Hong, MD     ____________________________________________   FINAL CLINICAL IMPRESSION(S) / ED DIAGNOSES  Final diagnoses:  Nonspecific chest pain  Atrial fibrillation with rapid ventricular response [facility])     ED Discharge Orders     None        Note:  This document was prepared using Dragon voice recognition software and may include unintentional dictation errors.    Irean Hong, MD 12/26/20 [phone]

## 2020-12-26 NOTE — Consult Note (Signed)
ANTICOAGULATION CONSULT NOTE - Initial Consult  Pharmacy Consult for heparin initiation  Indication: chest pain/ACS and new onset AFib  Allergies  Allergen Reactions   Erythromycin Other (See Comments)   Allopurinol Diarrhea   Ezetimibe Diarrhea   Morphine And Related Rash    Patient Measurements: Height: 5\' 3"  (160 cm) Weight: 108.9 kg (240 lb) IBW/kg (Calculated) : 52.4 Heparin Dosing Weight: 78.5kg  Vital Signs: Temp: 98.4 F (36.9 C) (08/05 2144) Temp Source: Oral (08/05 2144) BP: 164/72 (08/05 2144) Pulse Rate: 81 (08/05 2144)  Labs: Recent Labs    12/26/20 0634 12/26/20 0826 12/26/20 0922 12/26/20 1030 12/26/20 1434 12/26/20 2038  HGB 11.1*  --   --   --   --   --   HCT 32.8*  --   --   --   --   --   PLT 229  --   --   --   --   --   APTT  --   --  29  --   --   --   LABPROT  --   --  13.0  --   --   --   INR  --   --  1.0  --   --   --   HEPARINUNFRC  --   --   --   --   --  <0.10*  CREATININE 1.65*  --   --   --   --   --   TROPONINIHS 24* 420*  --  1,311* 2,199*  --      Estimated Creatinine Clearance: 33.8 mL/min (A) (by C-G formula based on SCr of 1.65 mg/dL (H)).   Medical History: Past Medical History:  Diagnosis Date   Anemia    CAD in native artery    a. MI ~ 2000 s/p PCI x 2; b. PCI ~ 2015; c. PCI ~ 2020 complicated vy contrast-induced nephropathy; d. PCI 07/2020   CKD (chronic kidney disease)    DM2 (diabetes mellitus, type 2) (HCC)    Essential hypertension    Falls    Hyperlipidemia LDL goal <70    Obesity    Peripheral neuropathy     Medications:  Medications Prior to Admission  Medication Sig Dispense Refill Last Dose   acidophilus (RISAQUAD) CAPS capsule Take 1 capsule by mouth daily.      amLODipine (NORVASC) 5 MG tablet Take 5 mg by mouth daily.   12/25/2020 at 0900   clopidogrel (PLAVIX) 75 MG tablet Take 75 mg by mouth daily.   12/25/2020 at 0900   furosemide (LASIX) 20 MG tablet Take 40 mg by mouth daily.   12/25/2020 at 0900    insulin glargine (LANTUS) 100 UNIT/ML Solostar Pen Inject 60 Units into the skin at bedtime.   12/25/2020 at 2100   linagliptin (TRADJENTA) 5 MG TABS tablet Take 5 mg by mouth daily.   12/25/2020 at 0900   losartan (COZAAR) 50 MG tablet Take 50 mg by mouth daily.   12/25/2020 at 0900   metFORMIN (GLUCOPHAGE) 1000 MG tablet Take 1,000 mg by mouth 2 (two) times daily.   12/25/2020 at 0900   metoprolol succinate (TOPROL-XL) 100 MG 24 hr tablet Take 50 mg by mouth at bedtime.   12/25/2020 at 2100   nitroGLYCERIN (NITRODUR - DOSED IN MG/24 HR) 0.2 mg/hr patch Place 0.2 mg onto the skin daily.      nitroGLYCERIN (NITROSTAT) 0.4 MG SL tablet Place 0.4 mg under the tongue every 5 (  five) minutes x 3 doses as needed for chest pain.   Unknown at PRN   Scheduled:   amLODipine  5 mg Oral Daily   aspirin  324 mg Oral NOW   Or   aspirin  300 mg Rectal NOW   [START ON 12/27/2020] aspirin EC  81 mg Oral Daily   atorvastatin  40 mg Oral Daily   clopidogrel  75 mg Oral Daily   furosemide  40 mg Oral Daily   heparin  2,300 Units Intravenous Once   insulin aspart  0-20 Units Subcutaneous TID WC   losartan  50 mg Oral Daily   metoprolol succinate  50 mg Oral QHS   nitroGLYCERIN  0.2 mg Transdermal Daily   sodium chloride flush  3 mL Intravenous Q12H   Infusions:   sodium chloride     heparin 1,100 Units/hr (12/26/20 1257)   PRN:  Anti-infectives (From admission, onward)    None       Assessment: Hannah Thomas is a 75yo female presenting with chest pain (Troponin trend: 24>420>1311), new onset atrial fibrilation (CHA2DS2-VASc=6), and rapid ventricular rate. Cardiology is following. Pharmacy was consulted for heparin initiation for ACS and new onset AFib.  After reviewing the baseline CBC, no contraindications for heparin are present.   The PTA medication list does not include a DOAC.  Goal of Therapy:  Heparin level 0.3-0.7 units/ml aPTT 66-102 seconds Monitor platelets by anticoagulation protocol: Yes    Plan:  8/5 20:38 HL < 0.10, subtherapeutic -Heparin 2300 units IV bolus -Increase Heparin drip to 1400 units/hr -Recheck HL in 8 hours -Daily CBC while on Heparin drip  Clovia Cuff, PharmD, BCPS 12/26/2020 10:29 PM

## 2020-12-26 NOTE — Consult Note (Signed)
Cardiology Consultation:   Patient ID: Hannah Thomas; [id number]; 07-29-1945   Admit date: 12/26/2020 Date of Consult: 12/26/2020  Primary Care Provider: Pcp, No Primary Cardiologist: [facility]) Primary Electrophysiologist:  None   Patient Profile:   Hannah Thomas is a 75 y.o. female with a hx of CAD status post multiple PCI's as outlined below, DM2 with peripheral neuropathy, CKD stage III, HTN, HLD intolerant to statins, morbid obesity, PAD, falls, and suspected sleep apnea who is being seen today for the evaluation of chest pressure with elevated troponin and new onset A. fib with RVR at the request of Dr. Joylene Igo.  History of Present Illness:   Hannah Thomas is followed by [facility] in [place].  Her history of CAD dates back to approximately 2002 at which time she had an MI at which time she decayed she underwent PCI x2.  She had repeat MI in 2015 with repeat PCI.  She was admitted in 07/2018 with chest pressure with cardiac catheter showing 80% stenosis in the mid LAD with an FFR of 0.47 which was treated with PCI/DES x1.  She indicates this procedure was complicated by contrast-induced nephropathy.  She was readmitted in 10/2018 with angina with repeat LHC showing 100% occlusion of the proximal LCx and 90% stenosis of the right posterior lateral segment status post PCI/DES x3 to the right posterior lateral branch.  Most recent LHC on 08/01/2020 demonstrated left main 20% stenosis, mid LAD 80% stenosis with an IFR of 0.52, proximal LCx 100% stenosis, proximal RCA 20% stenosis, distal RCA 20% stenosis, RPDA 30% stenosis with patent PL branch stents.  She underwent successful PCI/DES to the mid LAD.  She indicated she was hydrated prior to this cath without any renal complications.  Echo in 07/2020 showed an EF of 68% and no significant valvular disease.  She splits her time between [place] and [place]. Lately, she has been under increased stress as she is putting her house up for  sale.   A couple of weeks ago she developed randomly occurring right and left chest tightness that was different than her prior angina.  Symptoms did improve with sublingual nitroglycerin.  More recently, she was woken from sleep around midnight this morning with chest tightness that felt similar to her prior angina.  Symptoms improved with SL NTG and she went back to sleep.  She was woken up again around 5 AM with recurrent chest tightness that did not improve with SL NTG prompting her to come to the ED.  Leading up to the above events, she has not had any exertional symptoms.  No associated symptoms.  Upon her arrival to [facility], vitals were stable with BP ranging from the 1-teens to 160s systolic. She was noted to be in Afib with RVR. Initial HS-Tn 24 with a delta troponin of 420. Potassium 4.1, BUN 51, SCr 1.65, WBC 8.8, HGB 11.1, PLT 229, TSH normal, magnesium 2.0, Covid and influenza negative. CXR showed no evidence of acute cardiopulmonary disease with low lung volumes, mid linear scarring vs subsegmental atelectasis in the left mid lower lung, and aortic atherosclerosis. She has received ASA 81 mg and IV Cardizem 15 mg x 1. She has converted to sinus rhythm. Currently, chest pain-free and maintaining sinus rhythm.  Past Medical History:  Diagnosis Date   Anemia    CAD in native artery    a. MI ~ 2000 s/p PCI x 2; b. PCI ~ 2015; c. PCI ~ 2020 complicated vy contrast-induced  nephropathy; d. PCI 07/2020   CKD (chronic kidney disease)    DM2 (diabetes mellitus, type 2) (HCC)    Essential hypertension    Falls    Hyperlipidemia LDL goal <70    Obesity    Peripheral neuropathy      Home Meds: Prior to Admission medications   Not on File    Inpatient Medications: Scheduled Meds:  Continuous Infusions:  PRN Meds:   Allergies:   Allergies  Allergen Reactions   Erythromycin Other (See Comments)   Morphine And Related Rash    Social History:   Social History   Socioeconomic  History   Marital status: Married    Spouse name: Not on file   Number of children: Not on file   Years of education: Not on file   Highest education level: Not on file  Occupational History   Not on file  Tobacco Use   Smoking status: Former    Packs/day: 0.00    Types: Cigarettes    Start date: 72    Quit date: 62    Years since quitting: 45.6   Smokeless tobacco: Not on file  Substance and Sexual Activity   Alcohol use: Not Currently   Drug use: Not on file   Sexual activity: Not on file  Other Topics Concern   Not on file  Social History Narrative   Not on file   Social Determinants of Health   Financial Resource Strain: Not on file  Food Insecurity: Not on file  Transportation Needs: Not on file  Physical Activity: Not on file  Stress: Not on file  Social Connections: Not on file  Intimate Partner Violence: Not on file     Family History:   Family History  Problem Relation Age of Onset   Breast cancer Mother    CAD Mother    Hypertension Mother    Migraines Mother    Stroke Mother    Dementia Father    Parkinson's disease Father    Endometrial cancer Sister     ROS:  Review of Systems  Constitutional:  Positive for malaise/fatigue. Negative for chills, diaphoresis, fever and weight loss.  HENT:  Negative for congestion.   Eyes:  Negative for discharge and redness.  Respiratory:  Negative for cough, sputum production, shortness of breath and wheezing.   Cardiovascular:  Positive for chest pain. Negative for palpitations, orthopnea, claudication, leg swelling and PND.  Gastrointestinal:  Negative for abdominal pain, heartburn, nausea and vomiting.  Musculoskeletal:  Positive for falls. Negative for myalgias.  Skin:  Negative for rash.  Neurological:  Negative for dizziness, tingling, tremors, sensory change, speech change, focal weakness, loss of consciousness and weakness.  Endo/Heme/Allergies:  Does not bruise/bleed easily.   Psychiatric/Behavioral:  Negative for substance abuse. The patient is not nervous/anxious.   All other systems reviewed and are negative.    Physical Exam/Data:   Vitals:   12/26/20 0730 12/26/20 0800 12/26/20 0830 12/26/20 0900  BP: (!) 121/48 (!) 140/54 (!) 161/64 (!) 161/53  Pulse: 81 81 83 81  Resp: 16 18 (!) 29 (!) 21  Temp:      TempSrc:      SpO2: 96% 97% 97% 98%  Weight:      Height:       No intake or output data in the 24 hours ending 12/26/20 1042 Filed Weights   12/26/20 0616  Weight: 108.9 kg   Body mass index is 42.51 kg/m.   Physical Exam: General: Well  developed, well nourished, in no acute distress. Head: Normocephalic, atraumatic, sclera non-icteric, no xanthomas, nares without discharge.  Neck: Negative for carotid bruits. JVD not elevated. Lungs: Clear bilaterally to auscultation without wheezes, rales, or rhonchi. Breathing is unlabored. Heart: RRR with S1 S2. No murmurs, rubs, or gallops appreciated. Abdomen: Soft, non-tender, non-distended with normoactive bowel sounds. No hepatomegaly. No rebound/guarding. No obvious abdominal masses. Msk:  Strength and tone appear normal for age. Extremities: No clubbing or cyanosis. No edema. Distal pedal pulses are 2+ and equal bilaterally. Neuro: Alert and oriented X 3. No facial asymmetry. No focal deficit. Moves all extremities spontaneously. Psych:  Responds to questions appropriately with a normal affect.   EKG:  The EKG was personally reviewed and demonstrates: Afib with RVR, 150 bpm, LVH, bifascicular block, no acute st/t changes Telemetry:  Telemetry was personally reviewed and demonstrates: A. fib with RVR converting to sinus rhythm around 7 AM this morning which has been maintained since  Weights: Filed Weights   12/26/20 0616  Weight: 108.9 kg    Relevant CV Studies:  None available for review  Laboratory Data:  Chemistry Recent Labs  Lab 12/26/20 0634  NA 140  K 4.1  CL 107  CO2 21*   GLUCOSE 195*  BUN 51*  CREATININE 1.65*  CALCIUM 9.3  GFRNONAA 32*  ANIONGAP 12    Recent Labs  Lab 12/26/20 0634  PROT 7.6  ALBUMIN 3.8  AST 26  ALT 15  ALKPHOS 74  BILITOT 0.7   Hematology Recent Labs  Lab 12/26/20 0634  WBC 8.8  RBC 3.53*  HGB 11.1*  HCT 32.8*  MCV 92.9  MCH 31.4  MCHC 33.8  RDW 13.9  PLT 229   Cardiac EnzymesNo results for input(s): TROPONINI in the last 168 hours. No results for input(s): TROPIPOC in the last 168 hours.  BNPNo results for input(s): BNP, PROBNP in the last 168 hours.  DDimer No results for input(s): DDIMER in the last 168 hours.  [facility]Studies:  [facility]  Result Date: 12/26/2020 IMPRESSION: 1. Low lung volumes without definite radiographic evidence of acute cardiopulmonary disease. 2. Mild linear scarring or subsegmental atelectasis in the left mid to lower lung. 3. Aortic atherosclerosis. Electronically Signed   By: Trudie Reedaniel  Entrikin M.D.   On: 12/26/2020 07:04    Assessment and Plan:   1. New onset Afib with RVR: -Presented in A. fib with RVR with ventricular rates in the 150s bpm -Currently in sinus rhythm following IV Cardizem -Resume PTA metoprolol  -CHADS2VASc at least 6 (HTN, age x 2, DM, vascular disease, sex category) -Heparin gtt for now until it is clear she will not need inpatient procedures -She should be transitioned to DOAC prior to discharge -Echo -Potassium and magnesium at goal -TSH normal -Needs treatment for presumed newly diagnosed sleep apnea  2.  CAD involving the native coronary status post multiple PCIs most recently in 07/2020 to the mid LAD with elevated high-sensitivity troponin: -Currently chest pain-free -Symptoms feel similar to prior angina -Cannot exclude supply demand ischemia in the setting of A. fib with RVR, accelerated hypertension, and underlying renal disease -Initial high-sensitivity troponin 24 with a delta troponin of 420, continue to cycle until peak -Heparin  drip -ASA/Plavix -She is quite leery of undergoing cardiac cath, as she indicates cath in 2020 was complicated by contrast-induced nephropathy -If catheter is needed, she would need IV hydration for at least 12 hours the day prior given her history  3. HTN: -Blood  pressure mildly elevated in the ED -Recommend resuming PTA medications with titration as indicated  4. CKD sage III: -Avoid nephrotoxic agents  5. Anemia: -Uncertain baseline/etiology -Possibly of chronic disease -Monitor on heparin gtt -Per IM  6.  DM2 with associated peripheral neuropathy: -Hold metformin upon admission -SSI per IM -Check A1c  7.  Obesity with sleep disordered breathing: -She indicates prior to leaving [place] she underwent a sleep study and was told her results were abnormal with further details unclear -Weight loss is encouraged to heart healthy diet and regular exercise -She will need to follow-up as an outpatient for what appears to be newly diagnosed sleep apnea  8.  Falls: -She decayed she trips approximately once per year in the setting of peripheral neuropathy -Recommend PT/OT  9.  HLD: -Check lipid panel -She reports she is intolerant to low intensity, medium intensity, and high intensity statins -Trial of ezetimibe -If she does not tolerate ezetimibe, or if LDL remains above goal of 70 she would benefit from a PCSK9i or bempedoic acid  10.  PAD: -She reports recent abnormal ABIs with plans to follow-up with vascular surgery in [place] -No critical limb ischemia    For questions or updates, please contact [facility] Please consult [email or web address] for contact info under Cardiology/STEMI.   Signed, Eula Listen, PA-C [facility] Pager: [phone] 12/26/2020, 10:42 AM

## 2020-12-26 NOTE — ED Notes (Signed)
Pt advised she takes Metoprolol 50 mg at bedtime. She doesn't want to take the 25 mg now. She would like to take the 50 at bedtime.

## 2020-12-27 DIAGNOSIS — I4891 Unspecified atrial fibrillation: Secondary | ICD-10-CM | POA: Diagnosis not present

## 2020-12-27 DIAGNOSIS — Z20822 Contact with and (suspected) exposure to covid-19: Secondary | ICD-10-CM | POA: Diagnosis present

## 2020-12-27 DIAGNOSIS — E785 Hyperlipidemia, unspecified: Secondary | ICD-10-CM | POA: Diagnosis present

## 2020-12-27 DIAGNOSIS — I252 Old myocardial infarction: Secondary | ICD-10-CM | POA: Diagnosis not present

## 2020-12-27 DIAGNOSIS — D631 Anemia in chronic kidney disease: Secondary | ICD-10-CM | POA: Diagnosis present

## 2020-12-27 DIAGNOSIS — Z803 Family history of malignant neoplasm of breast: Secondary | ICD-10-CM | POA: Diagnosis not present

## 2020-12-27 DIAGNOSIS — I4510 Unspecified right bundle-branch block: Secondary | ICD-10-CM | POA: Diagnosis present

## 2020-12-27 DIAGNOSIS — Z888 Allergy status to other drugs, medicaments and biological substances status: Secondary | ICD-10-CM | POA: Diagnosis not present

## 2020-12-27 DIAGNOSIS — Z8249 Family history of ischemic heart disease and other diseases of the circulatory system: Secondary | ICD-10-CM | POA: Diagnosis not present

## 2020-12-27 DIAGNOSIS — I248 Other forms of acute ischemic heart disease: Secondary | ICD-10-CM | POA: Diagnosis present

## 2020-12-27 DIAGNOSIS — F419 Anxiety disorder, unspecified: Secondary | ICD-10-CM | POA: Diagnosis present

## 2020-12-27 DIAGNOSIS — Z885 Allergy status to narcotic agent status: Secondary | ICD-10-CM | POA: Diagnosis not present

## 2020-12-27 DIAGNOSIS — Z823 Family history of stroke: Secondary | ICD-10-CM | POA: Diagnosis not present

## 2020-12-27 DIAGNOSIS — I214 Non-ST elevation (NSTEMI) myocardial infarction: Secondary | ICD-10-CM | POA: Diagnosis not present

## 2020-12-27 DIAGNOSIS — I480 Paroxysmal atrial fibrillation: Secondary | ICD-10-CM | POA: Diagnosis present

## 2020-12-27 DIAGNOSIS — Z8049 Family history of malignant neoplasm of other genital organs: Secondary | ICD-10-CM | POA: Diagnosis not present

## 2020-12-27 DIAGNOSIS — Z6841 Body Mass Index (BMI) 40.0 and over, adult: Secondary | ICD-10-CM | POA: Diagnosis not present

## 2020-12-27 DIAGNOSIS — Z87891 Personal history of nicotine dependence: Secondary | ICD-10-CM | POA: Diagnosis not present

## 2020-12-27 DIAGNOSIS — I2510 Atherosclerotic heart disease of native coronary artery without angina pectoris: Secondary | ICD-10-CM | POA: Diagnosis not present

## 2020-12-27 DIAGNOSIS — I129 Hypertensive chronic kidney disease with stage 1 through stage 4 chronic kidney disease, or unspecified chronic kidney disease: Secondary | ICD-10-CM | POA: Diagnosis present

## 2020-12-27 DIAGNOSIS — E1142 Type 2 diabetes mellitus with diabetic polyneuropathy: Secondary | ICD-10-CM | POA: Diagnosis present

## 2020-12-27 DIAGNOSIS — N183 Chronic kidney disease, stage 3 unspecified: Secondary | ICD-10-CM | POA: Diagnosis present

## 2020-12-27 DIAGNOSIS — Z955 Presence of coronary angioplasty implant and graft: Secondary | ICD-10-CM | POA: Diagnosis not present

## 2020-12-27 DIAGNOSIS — Z820 Family history of epilepsy and other diseases of the nervous system: Secondary | ICD-10-CM | POA: Diagnosis not present

## 2020-12-27 DIAGNOSIS — E1122 Type 2 diabetes mellitus with diabetic chronic kidney disease: Secondary | ICD-10-CM | POA: Diagnosis present

## 2020-12-27 LAB — CBC
HCT: 33 % — ABNORMAL LOW (ref 36.0–46.0)
Hemoglobin: 11 g/dL — ABNORMAL LOW (ref 12.0–15.0)
MCH: 30.8 pg (ref 26.0–34.0)
MCHC: 33.3 g/dL (ref 30.0–36.0)
MCV: 92.4 fL (ref 80.0–100.0)
Platelets: 223 10*3/uL (ref 150–400)
RBC: 3.57 MIL/uL — ABNORMAL LOW (ref 3.87–5.11)
RDW: 13.6 % (ref 11.5–15.5)
WBC: 9.4 10*3/uL (ref 4.0–10.5)
nRBC: 0 % (ref 0.0–0.2)

## 2020-12-27 LAB — LIPID PANEL
Cholesterol: 216 mg/dL — ABNORMAL HIGH (ref 0–200)
HDL: 34 mg/dL — ABNORMAL LOW (ref >40)
LDL Cholesterol: 113 mg/dL — ABNORMAL HIGH (ref 0–99)
Total CHOL/HDL Ratio: 6.4 RATIO
Triglycerides: 343 mg/dL — ABNORMAL HIGH (ref <150)
VLDL: 69 mg/dL — ABNORMAL HIGH (ref 0–40)

## 2020-12-27 LAB — GLUCOSE, CAPILLARY
Glucose-Capillary: 74 mg/dL (ref 70–99)
Glucose-Capillary: 92 mg/dL (ref 70–99)
Glucose-Capillary: 136 mg/dL — ABNORMAL HIGH (ref 70–99)
Glucose-Capillary: 165 mg/dL — ABNORMAL HIGH (ref 70–99)

## 2020-12-27 LAB — TROPONIN I (HIGH SENSITIVITY): Troponin I (High Sensitivity): 1330 ng/L (ref <18)

## 2020-12-27 LAB — HEPARIN LEVEL (UNFRACTIONATED)
Heparin Unfractionated: 0.1 IU/mL — ABNORMAL LOW (ref 0.30–0.70)
Heparin Unfractionated: 0.45 IU/mL (ref 0.30–0.70)

## 2020-12-27 LAB — CREATININE, SERUM
Creatinine, Ser: 1.41 mg/dL — ABNORMAL HIGH (ref 0.44–1.00)
GFR, Estimated: 38 mL/min — ABNORMAL LOW (ref >60)

## 2020-12-27 MED ORDER — HEPARIN BOLUS VIA INFUSION
2300.0000 [IU] | Freq: Once | INTRAVENOUS | Status: AC
  Administered 2020-12-27: 2300 [IU] via INTRAVENOUS
  Filled 2020-12-27: qty 2300

## 2020-12-27 NOTE — Consult Note (Signed)
ANTICOAGULATION CONSULT NOTE  Pharmacy Consult for heparin Indication: chest pain/ACS and new onset AFib  Allergies  Allergen Reactions   Erythromycin Other (See Comments)   Allopurinol Diarrhea   Ezetimibe Diarrhea   Morphine And Related Rash    Patient Measurements: Height: 5\' 3"  (160 cm) Weight: 108.9 kg (240 lb) IBW/kg (Calculated) : 52.4 Heparin Dosing Weight: 78.5kg  Vital Signs: Temp: 98.1 F (36.7 C) (08/06 0350) Temp Source: Oral (08/06 0350) BP: 157/58 (08/06 0350) Pulse Rate: 76 (08/06 0350)  Labs: Recent Labs    12/26/20 0634 12/26/20 0826 12/26/20 0922 12/26/20 1030 12/26/20 1434 12/26/20 2038 12/27/20 0630  HGB 11.1*  --   --   --   --   --  11.0*  HCT 32.8*  --   --   --   --   --  33.0*  PLT 229  --   --   --   --   --  223  APTT  --   --  29  --   --   --   --   LABPROT  --   --  13.0  --   --   --   --   INR  --   --  1.0  --   --   --   --   HEPARINUNFRC  --   --   --   --   --  <0.10* 0.10*  CREATININE 1.65*  --   --   --   --   --   --   TROPONINIHS 24* 420*  --  1,311* 2,199*  --   --      Estimated Creatinine Clearance: 33.8 mL/min (A) (by C-G formula based on SCr of 1.65 mg/dL (H)).   Medical History: Past Medical History:  Diagnosis Date   Anemia    CAD in native artery    a. MI ~ 2000 s/p PCI x 2; b. PCI ~ 2015; c. PCI ~ 2020 complicated vy contrast-induced nephropathy; d. PCI 07/2020   CKD (chronic kidney disease)    DM2 (diabetes mellitus, type 2) (HCC)    Essential hypertension    Falls    Hyperlipidemia LDL goal <70    Obesity    Peripheral neuropathy       Assessment: Hannah Thomas is a 75yo female presenting with chest pain, new onset atrial fibrilation (CHA2DS2-VASc=6), and rapid ventricular rate. Cardiology is following. Pharmacy was consulted for heparin initiation for ACS and new onset AFib.  The PTA medication list does not include a DOAC.  Goal of Therapy:  Heparin level 0.3-0.7 units/ml aPTT 66-102  seconds Monitor platelets by anticoagulation protocol: Yes   Plan:  8/6 0630 HL 0.10, subtherapeutic -Heparin 2300 units IV bolus -Increase Heparin drip to 1700 units/hr -Recheck HL in 8 hours -Daily CBC while on Heparin drip  75yo, PharmD, BCPS 12/27/2020 7:21 AM

## 2020-12-27 NOTE — Plan of Care (Signed)
  Problem: Education: Goal: Knowledge of General Education information will improve Description: Including pain rating scale, medication(s)/side effects and non-pharmacologic comfort measures 12/27/2020 1229 by Ansel Bong, RN Outcome: Progressing 12/27/2020 1229 by Ansel Bong, RN Outcome: Progressing   Problem: Health Behavior/Discharge Planning: Goal: Ability to manage health-related needs will improve 12/27/2020 1229 by Ansel Bong, RN Outcome: Progressing 12/27/2020 1229 by Ansel Bong, RN Outcome: Progressing   Problem: Clinical Measurements: Goal: Ability to maintain clinical measurements within normal limits will improve 12/27/2020 1229 by Ansel Bong, RN Outcome: Progressing 12/27/2020 1229 by Ansel Bong, RN Outcome: Progressing Goal: Will remain free from infection 12/27/2020 1229 by Ansel Bong, RN Outcome: Progressing 12/27/2020 1229 by Ansel Bong, RN Outcome: Progressing Goal: Diagnostic test results will improve 12/27/2020 1229 by Ansel Bong, RN Outcome: Progressing 12/27/2020 1229 by Ansel Bong, RN Outcome: Progressing Goal: Respiratory complications will improve 12/27/2020 1229 by Ansel Bong, RN Outcome: Progressing 12/27/2020 1229 by Ansel Bong, RN Outcome: Progressing Goal: Cardiovascular complication will be avoided 12/27/2020 1229 by Ansel Bong, RN Outcome: Progressing 12/27/2020 1229 by Ansel Bong, RN Outcome: Progressing   Problem: Activity: Goal: Risk for activity intolerance will decrease 12/27/2020 1229 by Ansel Bong, RN Outcome: Progressing 12/27/2020 1229 by Ansel Bong, RN Outcome: Progressing   Problem: Nutrition: Goal: Adequate nutrition will be maintained 12/27/2020 1229 by Ansel Bong, RN Outcome: Progressing 12/27/2020 1229 by Ansel Bong, RN Outcome: Progressing   Problem: Coping: Goal: Level of anxiety will decrease 12/27/2020 1229 by Ansel Bong, RN Outcome: Progressing 12/27/2020 1229 by  Ansel Bong, RN Outcome: Progressing   Problem: Elimination: Goal: Will not experience complications related to bowel motility 12/27/2020 1229 by Ansel Bong, RN Outcome: Progressing 12/27/2020 1229 by Ansel Bong, RN Outcome: Progressing Goal: Will not experience complications related to urinary retention 12/27/2020 1229 by Ansel Bong, RN Outcome: Progressing 12/27/2020 1229 by Ansel Bong, RN Outcome: Progressing   Problem: Pain Managment: Goal: General experience of comfort will improve 12/27/2020 1229 by Ansel Bong, RN Outcome: Progressing 12/27/2020 1229 by Ansel Bong, RN Outcome: Progressing   Problem: Safety: Goal: Ability to remain free from injury will improve 12/27/2020 1229 by Ansel Bong, RN Outcome: Progressing 12/27/2020 1229 by Ansel Bong, RN Outcome: Progressing   Problem: Skin Integrity: Goal: Risk for impaired skin integrity will decrease 12/27/2020 1229 by Ansel Bong, RN Outcome: Progressing 12/27/2020 1229 by Ansel Bong, RN Outcome: Progressing

## 2020-12-27 NOTE — Progress Notes (Addendum)
Progress Note  Patient Name: Hannah Thomas Date of Encounter: 12/27/2020  Primary Cardiologist: [facility])  Subjective   No further chest pain. Brief episode of SOB with associated headache overnight. Currently, symptom-free. Maintaining sinus rhythm.  HS-Tn has trended to 2199 to date. Echo showed a preserved LVSF with basal inferior wall motion abnormality. Plan is for [facility] Monday, 12/29/2020. She will need 12 hours of IV hydration given prior history of contrast-induced nephropathy and current CKD.   Inpatient Medications    Scheduled Meds:  amLODipine  5 mg Oral Daily   aspirin  324 mg Oral NOW   Or   aspirin  300 mg Rectal NOW   aspirin EC  81 mg Oral Daily   atorvastatin  40 mg Oral Daily   clopidogrel  75 mg Oral Daily   furosemide  40 mg Oral Daily   insulin aspart  0-20 Units Subcutaneous TID WC   losartan  50 mg Oral Daily   metoprolol succinate  50 mg Oral QHS   nitroGLYCERIN  0.2 mg Transdermal Daily   sodium chloride flush  3 mL Intravenous Q12H   Continuous Infusions:  sodium chloride     heparin 1,700 Units/hr (12/27/20 0734)   PRN Meds: sodium chloride, acetaminophen, nitroGLYCERIN, ondansetron (ZOFRAN) IV, sodium chloride flush   Vital Signs    Vitals:   12/26/20 2144 12/26/20 2341 12/27/20 0350 12/27/20 0828  BP: (!) 164/72 (!) 172/61 (!) 157/58 (!) 165/68  Pulse: 81 78 76 72  Resp: 16 17 16 18   Temp: 98.4 F (36.9 C) 98.3 F (36.8 C) 98.1 F (36.7 C) 98.2 F (36.8 C)  TempSrc: Oral Oral Oral   SpO2: 98% 96% 98% 99%  Weight:      Height:        Intake/Output Summary (Last 24 hours) at 12/27/2020 0855 Last data filed at 12/27/2020 0400 Gross per 24 hour  Intake 1374.87 ml  Output --  Net 1374.87 ml   Filed Weights   12/26/20 0616  Weight: 108.9 kg    Telemetry    SR - Personally Reviewed  ECG    No new tracings - Personally Reviewed  Physical Exam   GEN: No acute distress.   Neck: No JVD. Cardiac: RRR, I/VI  systolic murmur at the base, no rubs, or gallops.  Respiratory: Clear to auscultation bilaterally.  GI: Soft, nontender, non-distended.   MS: No edema; No deformity. Neuro:  Alert and oriented x 3; Nonfocal.  Psych: Normal affect.  Labs    Chemistry Recent Labs  Lab 12/26/20 0634 12/27/20 0630  NA 140  --   K 4.1  --   CL 107  --   CO2 21*  --   GLUCOSE 195*  --   BUN 51*  --   CREATININE 1.65* 1.41*  CALCIUM 9.3  --   PROT 7.6  --   ALBUMIN 3.8  --   AST 26  --   ALT 15  --   ALKPHOS 74  --   BILITOT 0.7  --   GFRNONAA 32* 38*  ANIONGAP 12  --      Hematology Recent Labs  Lab 12/26/20 0634 12/27/20 0630  WBC 8.8 9.4  RBC 3.53* 3.57*  HGB 11.1* 11.0*  HCT 32.8* 33.0*  MCV 92.9 92.4  MCH 31.4 30.8  MCHC 33.8 33.3  RDW 13.9 13.6  PLT 229 223    Cardiac EnzymesNo results for input(s): TROPONINI in the last 168 hours.  No results for input(s): TROPIPOC in the last 168 hours.   BNPNo results for input(s): BNP, PROBNP in the last 168 hours.   DDimer No results for input(s): DDIMER in the last 168 hours.   Radiology    DG Chest Port 1 View  Result Date: 12/26/2020 IMPRESSION: 1. Low lung volumes without definite radiographic evidence of acute cardiopulmonary disease. 2. Mild linear scarring or subsegmental atelectasis in the left mid to lower lung. 3. Aortic atherosclerosis. Electronically Signed   By: Trudie Reed M.D.   On: 12/26/2020 07:04    Cardiac Studies   2D echo 12/26/2020: 1. Left ventricular ejection fraction, by estimation, is 55 to 60%. The  left ventricle has normal function. The left ventricle demonstrates  regional wall motion abnormalities (see scoring diagram/findings for  description). There is mild left ventricular   hypertrophy. Left ventricular diastolic parameters were normal. There is  mild hypokinesis of the left ventricular, basal-mid inferior wall.   2. Right ventricular systolic function is normal. The right ventricular  size  is normal. Tricuspid regurgitation signal is inadequate for assessing  PA pressure.   3. The mitral valve is normal in structure. Mild mitral valve  regurgitation. No evidence of mitral stenosis.   4. The aortic valve is normal in structure. Aortic valve regurgitation is  not visualized. Mild to moderate aortic valve sclerosis/calcification is  present, without any evidence of aortic stenosis.   Patient Profile     75 y.o. female with history of CAD status post multiple PCI's as outlined below, DM2 with peripheral neuropathy, CKD stage III, HTN, HLD intolerant to statins, morbid obesity, PAD, falls, and suspected sleep apnea who is being seen today for the evaluation of chest pressure with elevated troponin and new onset A. fib with RVR at the request of Dr. Joylene Igo.  Assessment & Plan    1. New onset Afib with RVR: -Presented in A. fib with RVR with ventricular rates in the 150s bpm -Maintaining sinus rhythm following IV Cardizem in the ED, which has been stopped -Continue metoprolol  -CHADS2VASc at least 6 (HTN, age x 2, DM, vascular disease, sex category) -Heparin gtt for now until it is clear she will not need inpatient procedures -She should be transitioned to DOAC prior to discharge -Echo as above -Potassium and magnesium at goal -TSH normal -Needs treatment for presumed newly diagnosed sleep apnea   2.  CAD involving the native coronary status post multiple PCIs most recently in 07/2020 to the mid LAD with NSTEMI: -Currently chest pain-free -Symptoms feel similar to prior angina -Initial high-sensitivity troponin 24 with a delta troponin of 420, currently trending to 2199, continue to cycle until peak -Heparin drip as above -ASA/Plavix for now, though will ultimately need to stop ASA to minimize bleeding risk given the expected initiation of DOAC prior to discharge  -She will need a LHC Monday, 8/8 with 12 hours of IV hydration prior given underlying CKD and history of  contrast-induced nephropathy  -NPO at midnight 8/8 -Risks and benefits of cardiac catheterization have been discussed with the patient including risks of bleeding, bruising, infection, kidney damage, stroke, heart attack, urgent need for bypass and death. The patient understands these risks and is willing to proceed with the procedure. All questions have been answered and concerns listened to   3. HTN: -Blood pressure remains mildly elevated -She has not been receiving ordered medications  -Recommend giving medications with titration as indicated   4. CKD sage III: -Avoid nephrotoxic agents  5. Anemia: -Uncertain baseline/etiology -Possibly of chronic disease -Monitor on heparin gtt -Per IM   6.  DM2 with associated peripheral neuropathy: -Hold metformin upon admission -SSI per IM -A1c 6.7   7.  Obesity with sleep disordered breathing: -She indicates prior to leaving [place] she underwent a sleep study and was told her results were abnormal with further details unclear -Weight loss is encouraged to heart healthy diet and regular exercise -She will need to follow-up as an outpatient for what appears to be newly diagnosed sleep apnea   8.  Falls: -She decayed she trips approximately once per year in the setting of peripheral neuropathy -Recommend PT/OT   9.  HLD: -LDL 113 with goal being < 70 -She reports she is intolerant to low intensity, medium intensity, and high intensity statins as well as to Zetia -She declines rechallenge of statin or Zetia -She will need a PCSK9i or bempedoic acid in the outpatient setting   10.  PAD: -She reports recent abnormal ABIs with plans to follow-up with vascular surgery in [place] -No critical limb ischemia  For questions or updates, please contact [facility] Please consult [email or web address] for contact info under Cardiology/STEMI.    Signed, Eula Listen, PA-C [facility] Pager: [phone] 12/27/2020, 8:55 AM

## 2020-12-27 NOTE — Consult Note (Signed)
ANTICOAGULATION CONSULT NOTE  Pharmacy Consult for heparin Indication: chest pain/ACS and new onset AFib  Allergies  Allergen Reactions   Erythromycin Other (See Comments)   Allopurinol Diarrhea   Ezetimibe Diarrhea   Morphine And Related Rash    Patient Measurements: Height: 5\' 3"  (160 cm) Weight: 108.9 kg (240 lb) IBW/kg (Calculated) : 52.4 Heparin Dosing Weight: 78.5kg  Vital Signs: Temp: 97.7 F (36.5 C) (08/06 1640) BP: 140/51 (08/06 1640) Pulse Rate: 72 (08/06 1640)  Labs: Recent Labs    12/26/20 0634 12/26/20 0826 12/26/20 0922 12/26/20 1030 12/26/20 1434 12/26/20 2038 12/27/20 0630 12/27/20 0911 12/27/20 1737  HGB 11.1*  --   --   --   --   --  11.0*  --   --   HCT 32.8*  --   --   --   --   --  33.0*  --   --   PLT 229  --   --   --   --   --  223  --   --   APTT  --   --  29  --   --   --   --   --   --   LABPROT  --   --  13.0  --   --   --   --   --   --   INR  --   --  1.0  --   --   --   --   --   --   HEPARINUNFRC  --   --   --   --   --  <0.10* 0.10*  --  0.45  CREATININE 1.65*  --   --   --   --   --  1.41*  --   --   TROPONINIHS 24*   < >  --  1,311* 2,199*  --   --  1,330*  --    < > = values in this interval not displayed.     Estimated Creatinine Clearance: 39.6 mL/min (A) (by C-G formula based on SCr of 1.41 mg/dL (H)).   Medical History: Past Medical History:  Diagnosis Date   Anemia    CAD in native artery    a. MI ~ 2000 s/p PCI x 2; b. PCI ~ 2015; c. PCI ~ 2020 complicated vy contrast-induced nephropathy; d. PCI 07/2020   CKD (chronic kidney disease)    DM2 (diabetes mellitus, type 2) (HCC)    Essential hypertension    Falls    Hyperlipidemia LDL goal <70    Obesity    Peripheral neuropathy       Assessment: Hannah Thomas is a 75yo female presenting with chest pain, new onset atrial fibrilation (CHA2DS2-VASc=6), and rapid ventricular rate. Cardiology is following. Pharmacy was consulted for heparin initiation for ACS and new  onset AFib.  The PTA medication list does not include a DOAC.  0806 1737 HL 0.45 therapeutic x 1  Goal of Therapy:  Heparin level 0.3-0.7 units/ml aPTT 66-102 seconds Monitor platelets by anticoagulation protocol: Yes   Plan:  HL therapeutic as above x 1 -Continue  Heparin drip @ 1700 units/hr -Recheck HL in 8 hours for confirmation -Daily CBC while on Heparin drip  75yo, PharmD, BCPS Clinical Pharmacist 12/27/2020 6:07 PM

## 2020-12-27 NOTE — Progress Notes (Addendum)
PROGRESS NOTE    Hannah Thomas  WCH:[id number] DOB: Oct 09, 1945 DOA: 12/26/2020 PCP: Pcp, No    Brief Narrative:  Hannah Thomas is a 75 y.o. female with medical history significant for coronary artery disease status post PCI with stent angioplasty, hypertension, diabetes mellitus, dyslipidemia who presents to the emergency room for evaluation of chest pain. She presented for evaluation of midsternal chest pain that she described as pressure-like and a burning sensation that woke her up at about midnight.  She took 2 nitroglycerin pills with improvement in her symptoms and was able to go back to bed.  She awoke again at 5 AM with worsening chest pain and took 4 nitroglycerin tablets 5 minutes apart without any significant improvement in her symptoms.  Chest pain was nonradiating and was associated with diaphoresis and palpitations.  She denied having any nausea, no vomiting or shortness of breath.  Due to the persistence of her symptoms she decided to come to the emergency room to get checked.  Respiratory viral panel is negative  TP elevated  8/6 no chest pain this AM.  No anginal-like symptoms.    Consultants:  Cardiology  Procedures: Echo  Antimicrobials:     Subjective: No dizziness or lightheadedness  Objective: Vitals:   12/26/20 2038 12/26/20 2144 12/26/20 2341 12/27/20 0350  BP: (!) 152/122 (!) 164/72 (!) 172/61 (!) 157/58  Pulse: 75 81 78 76  Resp: 17 16 17 16   Temp:  98.4 F (36.9 C) 98.3 F (36.8 C) 98.1 F (36.7 C)  TempSrc:  Oral Oral Oral  SpO2: 96% 98% 96% 98%  Weight:      Height:        Intake/Output Summary (Last 24 hours) at 12/27/2020 0815 Last data filed at 12/27/2020 0400 Gross per 24 hour  Intake 1374.87 ml  Output --  Net 1374.87 ml   Filed Weights   12/26/20 0616  Weight: 108.9 kg    Examination:  General exam: Appears calm and comfortable  Respiratory system: Clear to auscultation. Respiratory effort normal. Cardiovascular system: S1 & S2  heard, RRR. No JVD, murmurs, rubs, gallops or clicks. Gastrointestinal system: Abdomen is nondistended, soft and nontender.  Normal bowel sounds heard. Central nervous system: Alert and oriented. No focal neurological deficits. Extremities: Varicose veins present trace edema.  Chronic skin changes Psychiatry: Judgement and insight appear normal. Mood & affect appropriate.     Data Reviewed: I have personally reviewed following labs and imaging studies  CBC: Recent Labs  Lab 12/26/20 0634 12/27/20 0630  WBC 8.8 9.4  HGB 11.1* 11.0*  HCT 32.8* 33.0*  MCV 92.9 92.4  PLT 229 223   Basic Metabolic Panel: Recent Labs  Lab 12/26/20 0634 12/26/20 0826  NA 140  --   K 4.1  --   CL 107  --   CO2 21*  --   GLUCOSE 195*  --   BUN 51*  --   CREATININE 1.65*  --   CALCIUM 9.3  --   MG  --  2.0   GFR: Estimated Creatinine Clearance: 33.8 mL/min (A) (by C-G formula based on SCr of 1.65 mg/dL (H)). Liver Function Tests: Recent Labs  Lab 12/26/20 0634  AST 26  ALT 15  ALKPHOS 74  BILITOT 0.7  PROT 7.6  ALBUMIN 3.8   No results for input(s): LIPASE, AMYLASE in the last 168 hours. No results for input(s): AMMONIA in the last 168 hours. Coagulation Profile: Recent Labs  Lab 12/26/20 0922  INR 1.0  Cardiac Enzymes: No results for input(s): CKTOTAL, CKMB, CKMBINDEX, TROPONINI in the last 168 hours. BNP (last 3 results) No results for input(s): PROBNP in the last 8760 hours. HbA1C: Recent Labs    12/26/20 1030  HGBA1C 6.7*   CBG: Recent Labs  Lab 12/26/20 1127 12/26/20 1859 12/26/20 2228  GLUCAP 143* 104* 136*   Lipid Profile: Recent Labs    12/27/20 0630  CHOL 216*  HDL 34*  LDLCALC 113*  TRIG 343*  CHOLHDL 6.4   Thyroid Function Tests: Recent Labs    12/26/20 0634  TSH 1.571  FREET4 0.90   Anemia Panel: No results for input(s): VITAMINB12, FOLATE, FERRITIN, TIBC, IRON, RETICCTPCT in the last 72 hours. Sepsis Labs: No results for input(s):  PROCALCITON, LATICACIDVEN in the last 168 hours.  Recent Results (from the past 240 hour(s))  Resp Panel by RT-PCR (Flu A&B, Covid) Nasopharyngeal Swab     Status: None   Collection Time: 12/26/20  6:41 AM   Specimen: Nasopharyngeal Swab; Nasopharyngeal(NP) swabs in vial transport medium  Result Value Ref Range Status   SARS Coronavirus 2 by RT PCR NEGATIVE NEGATIVE Final    Comment: (NOTE) SARS-CoV-2 target nucleic acids are NOT DETECTED.  The SARS-CoV-2 RNA is generally detectable in upper respiratory specimens during the acute phase of infection. The lowest concentration of SARS-CoV-2 viral copies this assay can detect is 138 copies/mL. A negative result does not preclude SARS-Cov-2 infection and should not be used as the sole basis for treatment or other patient management decisions. A negative result may occur with  improper specimen collection/handling, submission of specimen other than nasopharyngeal swab, presence of viral mutation(s) within the areas targeted by this assay, and inadequate number of viral copies(<138 copies/mL). A negative result must be combined with clinical observations, patient history, and epidemiological information. The expected result is Negative.  Fact Sheet for Patients:  BloggerCourse.comhttps://www.fda.gov/media/152166/download  Fact Sheet for Healthcare Providers:  SeriousBroker.ithttps://www.fda.gov/media/152162/download  This test is no t yet approved or cleared by the [place] FDA and  has been authorized for detection and/or diagnosis of SARS-CoV-2 by FDA under an Emergency Use Authorization (EUA). This EUA will remain  in effect (meaning this test can be used) for the duration of the COVID-19 declaration under Section 564(b)(1) of the Act, 21 U.S.C.section 360bbb-3(b)(1), unless the authorization is terminated  or revoked sooner.       Influenza A by PCR NEGATIVE NEGATIVE Final   Influenza B by PCR NEGATIVE NEGATIVE Final    Comment: (NOTE) The Xpert Xpress  SARS-CoV-2/FLU/RSV plus assay is intended as an aid in the diagnosis of influenza from Nasopharyngeal swab specimens and should not be used as a sole basis for treatment. Nasal washings and aspirates are unacceptable for Xpert Xpress SARS-CoV-2/FLU/RSV testing.  Fact Sheet for Patients: BloggerCourse.comhttps://www.fda.gov/media/152166/download  Fact Sheet for Healthcare Providers: SeriousBroker.ithttps://www.fda.gov/media/152162/download  This test is not yet approved or cleared by the [place] FDA and has been authorized for detection and/or diagnosis of SARS-CoV-2 by FDA under an Emergency Use Authorization (EUA). This EUA will remain in effect (meaning this test can be used) for the duration of the COVID-19 declaration under Section 564(b)(1) of the Act, 21 U.S.C. section 360bbb-3(b)(1), unless the authorization is terminated or revoked.  Performed at [facility], [address] [place], [place] [id number]          Radiology Studies: DG Chest StoryPort 1 View  Result Date: 12/26/2020 CLINICAL DATA:  [age] year old female with history of chest tightness and burning. EXAM:  PORTABLE CHEST 1 VIEW COMPARISON:  No priors. FINDINGS: Lung volumes are slightly low. Linear areas of scarring or subsegmental atelectasis are noted throughout the left mid to lower lung. No acute consolidative airspace disease. No pleural effusions. No pneumothorax. No evidence of pulmonary edema. Heart size is borderline enlarged accentuated by low lung volumes and portable AP technique. Upper mediastinal contours are within normal limits. Atherosclerotic calcifications in the arch of the aorta. IMPRESSION: 1. Low lung volumes without definite radiographic evidence of acute cardiopulmonary disease. 2. Mild linear scarring or subsegmental atelectasis in the left mid to lower lung. 3. Aortic atherosclerosis. Electronically Signed   By: Trudie Reed M.D.   On: 12/26/2020 07:04   ECHOCARDIOGRAM COMPLETE  Result Date: 12/26/2020     ECHOCARDIOGRAM REPORT   Patient Name:   Hannah Thomas  Date of Exam: 12/26/2020 Medical Rec #:  [id number]  Height:       63.0 in Accession #:    [id number] Weight:       240.0 lb Date of Birth:  Jul 08, 1945   BSA:          2.089 m Patient Age:    75 years   BP:           172/55 mmHg Patient Gender: F          HR:           73 bpm. Exam Location:  [facility] Procedure: 2D Echo, Cardiac Doppler and Color Doppler Indications:     Elevated Troponin  History:         Patient has no prior history of Echocardiogram examinations.                  CAD; Risk Factors:Diabetes.  Sonographer:     Cristela Blue RDCS (AE) Referring Phys:  [id number] Sondra Barges Diagnosing Phys: Lorine Bears MD  Sonographer Comments: Suboptimal apical window. IMPRESSIONS  1. Left ventricular ejection fraction, by estimation, is 55 to 60%. The left ventricle has normal function. The left ventricle demonstrates regional wall motion abnormalities (see scoring diagram/findings for description). There is mild left ventricular  hypertrophy. Left ventricular diastolic parameters were normal. There is mild hypokinesis of the left ventricular, basal-mid inferior wall.  2. Right ventricular systolic function is normal. The right ventricular size is normal. Tricuspid regurgitation signal is inadequate for assessing PA pressure.  3. The mitral valve is normal in structure. Mild mitral valve regurgitation. No evidence of mitral stenosis.  4. The aortic valve is normal in structure. Aortic valve regurgitation is not visualized. Mild to moderate aortic valve sclerosis/calcification is present, without any evidence of aortic stenosis. FINDINGS  Left Ventricle: Left ventricular ejection fraction, by estimation, is 55 to 60%. The left ventricle has normal function. The left ventricle demonstrates regional wall motion abnormalities. Mild hypokinesis of the left ventricular, basal-mid inferior wall. The left ventricular internal cavity size was normal in size. There is mild left  ventricular hypertrophy. Left ventricular diastolic parameters were normal. Right Ventricle: The right ventricular size is normal. No increase in right ventricular wall thickness. Right ventricular systolic function is normal. Tricuspid regurgitation signal is inadequate for assessing PA pressure. Left Atrium: Left atrial size was normal in size. Right Atrium: Right atrial size was normal in size. Pericardium: There is no evidence of pericardial effusion. Mitral Valve: The mitral valve is normal in structure. Mild mitral annular calcification. Mild mitral valve regurgitation. No evidence of mitral valve stenosis. Tricuspid Valve: The tricuspid valve is normal in structure.  Tricuspid valve regurgitation is not demonstrated. No evidence of tricuspid stenosis. Aortic Valve: The aortic valve is normal in structure. Aortic valve regurgitation is not visualized. Mild to moderate aortic valve sclerosis/calcification is present, without any evidence of aortic stenosis. Aortic valve mean gradient measures 5.0 mmHg. Aortic valve peak gradient measures 8.8 mmHg. Aortic valve area, by VTI measures 2.03 cm. Pulmonic Valve: The pulmonic valve was normal in structure. Pulmonic valve regurgitation is not visualized. No evidence of pulmonic stenosis. Aorta: The aortic root is normal in size and structure. Venous: The inferior vena cava was not well visualized. IAS/Shunts: No atrial level shunt detected by color flow Doppler.  LEFT VENTRICLE PLAX 2D LVIDd:         5.21 cm  Diastology LVIDs:         3.39 cm  LV e' medial:    6.96 cm/s LV PW:         1.03 cm  LV E/e' medial:  14.7 LV IVS:        1.30 cm  LV e' lateral:   9.36 cm/s LVOT diam:     2.10 cm  LV E/e' lateral: 10.9 LV SV:         67 LV SV Index:   32 LVOT Area:     3.46 cm  LEFT ATRIUM             Index       RIGHT ATRIUM           Index LA diam:        3.30 cm 1.58 cm/m  RA Area:     12.20 cm LA Vol (A2C):   49.2 ml 23.55 ml/m RA Volume:   24.10 ml  11.53 ml/m LA Vol  (A4C):   38.8 ml 18.57 ml/m LA Biplane Vol: 43.6 ml 20.87 ml/m  AORTIC VALVE                    PULMONIC VALVE AV Area (Vmax):    2.08 cm     PV Vmax:        0.60 m/s AV Area (Vmean):   1.86 cm     PV Peak grad:   1.4 mmHg AV Area (VTI):     2.03 cm     RVOT Peak grad: 3 mmHg AV Vmax:           148.67 cm/s AV Vmean:          106.800 cm/s AV VTI:            0.328 m AV Peak Grad:      8.8 mmHg AV Mean Grad:      5.0 mmHg LVOT Vmax:         89.40 cm/s LVOT Vmean:        57.200 cm/s LVOT VTI:          0.192 m LVOT/AV VTI ratio: 0.59  AORTA Ao Root diam: 2.93 cm MITRAL VALVE                TRICUSPID VALVE MV Area (PHT): 5.58 cm     TR Peak grad:   10.5 mmHg MV Decel Time: 136 msec     TR Vmax:        162.00 cm/s MV E velocity: 102.00 cm/s MV A velocity: 85.30 cm/s   SHUNTS MV E/A ratio:  1.20         Systemic VTI:  0.19 m  Systemic Diam: 2.10 cm Lorine Bears MD Electronically signed by Lorine Bears MD Signature Date/Time: 12/26/2020/3:21:03 PM    Final         Scheduled Meds:  amLODipine  5 mg Oral Daily   aspirin  324 mg Oral NOW   Or   aspirin  300 mg Rectal NOW   aspirin EC  81 mg Oral Daily   atorvastatin  40 mg Oral Daily   clopidogrel  75 mg Oral Daily   furosemide  40 mg Oral Daily   insulin aspart  0-20 Units Subcutaneous TID WC   losartan  50 mg Oral Daily   metoprolol succinate  50 mg Oral QHS   nitroGLYCERIN  0.2 mg Transdermal Daily   sodium chloride flush  3 mL Intravenous Q12H   Continuous Infusions:  sodium chloride     heparin 1,700 Units/hr (12/27/20 0734)    Assessment & Plan:   Principal Problem:   Atrial fibrillation with rapid ventricular response (HCC) Active Problems:   CAD in native artery   CKD stage 3 due to type 2 diabetes mellitus (HCC)   Obesity   Essential hypertension   NSTEMI (non-ST elevated myocardial infarction) (HCC)   Atrial fibrillation with rapid ventricular response (Paroxysmal) Patient presented to the ER  for evaluation of chest pain and was found to be in rapid atrial fibrillation. Received a dose of Cardizem 15 mg IV x1 and converted back to sinus rhythm Her CHA2DS2-VASc score is 5 8/6 currently in sinus rhythm  Continue Toprol-XL and heparin drip  Plan to start NOAC after procedure on Monday       Non-ST elevation MI History of CAD/PCI: Patient has a history of coronary artery disease and is status post stent angioplasty 8/6 cardiology following Troponins elevated Currently chest pain-free Continue aspirin, Plavix, heparin drip,  and beta-blockers Echo with wall motion abnormality and normal EF LDL goal less than 70, triglyceride elevated, start lipitor 80mg  qd Plan for cath on Monday     Diabetes mellitus with complications of stage III chronic kidney disease BG stable Continue R-ISS       Hypertension Continue metoprolol, amlodipine, Cozaar  If BP not well controlled can increase amlodipine          Morbid obesity (BMI 42) Complicates overall prognosis and care   DVT prophylaxis: Heparin drip Code Status: Full Family Communication: None at bedside Disposition Plan:  Status is: Observation  The patient will require care spanning > 2 midnights and should be moved to inpatient because: Inpatient level of care appropriate due to severity of illness  Dispo: The patient is from: Home              Anticipated d/c is to: Home              Patient currently is not medically stable to d/c.   Difficult to place patient No    LOC: 2-3 days. Plan for cath on monday        LOS: 0 days   Time spent: 45 minutes with more than 50% on COC he    Wednesday, MD [facility] Pager [phone]  If 7PM-7AM, please contact night-coverage 12/27/2020, 8:15 AM

## 2020-12-28 DIAGNOSIS — I4891 Unspecified atrial fibrillation: Secondary | ICD-10-CM | POA: Diagnosis not present

## 2020-12-28 DIAGNOSIS — I214 Non-ST elevation (NSTEMI) myocardial infarction: Secondary | ICD-10-CM | POA: Diagnosis not present

## 2020-12-28 LAB — CBC
HCT: 32.7 % — ABNORMAL LOW (ref 36.0–46.0)
Hemoglobin: 10.7 g/dL — ABNORMAL LOW (ref 12.0–15.0)
MCH: 30.3 pg (ref 26.0–34.0)
MCHC: 32.7 g/dL (ref 30.0–36.0)
MCV: 92.6 fL (ref 80.0–100.0)
Platelets: 214 10*3/uL (ref 150–400)
RBC: 3.53 MIL/uL — ABNORMAL LOW (ref 3.87–5.11)
RDW: 13.6 % (ref 11.5–15.5)
WBC: 9 10*3/uL (ref 4.0–10.5)
nRBC: 0 % (ref 0.0–0.2)

## 2020-12-28 LAB — GLUCOSE, CAPILLARY
Glucose-Capillary: 122 mg/dL — ABNORMAL HIGH (ref 70–99)
Glucose-Capillary: 196 mg/dL — ABNORMAL HIGH (ref 70–99)
Glucose-Capillary: 103 mg/dL — ABNORMAL HIGH (ref 70–99)
Glucose-Capillary: 267 mg/dL — ABNORMAL HIGH (ref 70–99)

## 2020-12-28 LAB — HEPARIN LEVEL (UNFRACTIONATED): Heparin Unfractionated: 0.4 IU/mL (ref 0.30–0.70)

## 2020-12-28 MED ORDER — SODIUM CHLORIDE 0.9 % IV SOLN
250.0000 mL | INTRAVENOUS | Status: DC | PRN
Start: 2020-12-28 — End: 2020-12-29

## 2020-12-28 MED ORDER — SODIUM CHLORIDE 0.9% FLUSH: 3.0000 mL | INTRAVENOUS | Status: DC | PRN

## 2020-12-28 MED ORDER — AMLODIPINE BESYLATE 10 MG PO TABS
10.0000 mg | ORAL_TABLET | Freq: Every day | ORAL | Status: DC
  Administered 2020-12-28: 10 mg via ORAL
  Filled 2020-12-28: qty 1

## 2020-12-28 MED ORDER — SODIUM CHLORIDE 0.9 % IV SOLN: INTRAVENOUS | Status: DC

## 2020-12-28 MED ORDER — SODIUM CHLORIDE 0.9% FLUSH: 3.0000 mL | Freq: Two times a day (BID) | INTRAVENOUS | Status: DC

## 2020-12-28 MED ORDER — EZETIMIBE 10 MG PO TABS
10.0000 mg | ORAL_TABLET | Freq: Every day | ORAL | Status: DC
  Filled 2020-12-28: qty 1

## 2020-12-28 NOTE — Progress Notes (Signed)
Progress Note  Patient Name: Hannah Thomas Date of Encounter: 12/28/2020  Primary Cardiologist: [facility])  Subjective   No further chest pain. Brief episode of SOB with associated headache overnight. Currently, symptom-free. Maintaining sinus rhythm.  Planning for [facility] 8/8.   HS-Tn has peaked at 2199, subsequently down trending. Echo showed a preserved LVSF with basal inferior wall motion abnormality.   Inpatient Medications    Scheduled Meds:  amLODipine  5 mg Oral Daily   aspirin EC  81 mg Oral Daily   clopidogrel  75 mg Oral Daily   furosemide  40 mg Oral Daily   insulin aspart  0-20 Units Subcutaneous TID WC   losartan  50 mg Oral Daily   metoprolol succinate  50 mg Oral QHS   nitroGLYCERIN  0.2 mg Transdermal Daily   sodium chloride flush  3 mL Intravenous Q12H   Continuous Infusions:  sodium chloride     heparin 1,700 Units/hr (12/28/20 0600)   PRN Meds: sodium chloride, acetaminophen, nitroGLYCERIN, ondansetron (ZOFRAN) IV, sodium chloride flush   Vital Signs    Vitals:   12/27/20 1640 12/27/20 2026 12/28/20 0408 12/28/20 0728  BP: (!) 140/51 (!) 143/58 (!) 140/54 (!) 152/50  Pulse: 72 79 68 66  Resp: 19 20 18 18   Temp: 97.7 F (36.5 C) 97.7 F (36.5 C) 98 F (36.7 C) 97.9 F (36.6 C)  TempSrc:      SpO2: 99% 97% 96% 98%  Weight:      Height:        Intake/Output Summary (Last 24 hours) at 12/28/2020 0739 Last data filed at 12/27/2020 1700 Gross per 24 hour  Intake 198.72 ml  Output 100 ml  Net 98.72 ml    Filed Weights   12/26/20 0616  Weight: 108.9 kg    Telemetry    SR - Personally Reviewed  ECG    No new tracings - Personally Reviewed  Physical Exam   GEN: No acute distress.   Neck: No JVD. Cardiac: RRR, I/VI systolic murmur at the base, no rubs, or gallops.  Respiratory: Clear to auscultation bilaterally.  GI: Soft, nontender, non-distended.   MS: No edema; No deformity. Neuro:  Alert and oriented x 3;  Nonfocal.  Psych: Normal affect.  Labs    Chemistry Recent Labs  Lab 12/26/20 0634 12/27/20 0630  NA 140  --   K 4.1  --   CL 107  --   CO2 21*  --   GLUCOSE 195*  --   BUN 51*  --   CREATININE 1.65* 1.41*  CALCIUM 9.3  --   PROT 7.6  --   ALBUMIN 3.8  --   AST 26  --   ALT 15  --   ALKPHOS 74  --   BILITOT 0.7  --   GFRNONAA 32* 38*  ANIONGAP 12  --       Hematology Recent Labs  Lab 12/26/20 0634 12/27/20 0630 12/28/20 0157  WBC 8.8 9.4 9.0  RBC 3.53* 3.57* 3.53*  HGB 11.1* 11.0* 10.7*  HCT 32.8* 33.0* 32.7*  MCV 92.9 92.4 92.6  MCH 31.4 30.8 30.3  MCHC 33.8 33.3 32.7  RDW 13.9 13.6 13.6  PLT 229 223 214     Cardiac EnzymesNo results for input(s): TROPONINI in the last 168 hours. No results for input(s): TROPIPOC in the last 168 hours.   BNPNo results for input(s): BNP, PROBNP in the last 168 hours.   DDimer No results  for input(s): DDIMER in the last 168 hours.   Radiology    DG Chest Port 1 View  Result Date: 12/26/2020 IMPRESSION: 1. Low lung volumes without definite radiographic evidence of acute cardiopulmonary disease. 2. Mild linear scarring or subsegmental atelectasis in the left mid to lower lung. 3. Aortic atherosclerosis. Electronically Signed   By: Trudie Reed M.D.   On: 12/26/2020 07:04    Cardiac Studies   2D echo 12/26/2020: 1. Left ventricular ejection fraction, by estimation, is 55 to 60%. The  left ventricle has normal function. The left ventricle demonstrates  regional wall motion abnormalities (see scoring diagram/findings for  description). There is mild left ventricular   hypertrophy. Left ventricular diastolic parameters were normal. There is  mild hypokinesis of the left ventricular, basal-mid inferior wall.   2. Right ventricular systolic function is normal. The right ventricular  size is normal. Tricuspid regurgitation signal is inadequate for assessing  PA pressure.   3. The mitral valve is normal in structure. Mild  mitral valve  regurgitation. No evidence of mitral stenosis.   4. The aortic valve is normal in structure. Aortic valve regurgitation is  not visualized. Mild to moderate aortic valve sclerosis/calcification is  present, without any evidence of aortic stenosis.   Patient Profile     75 y.o. female with history of CAD status post multiple PCI's as outlined below, DM2 with peripheral neuropathy, CKD stage III, HTN, HLD intolerant to statins, morbid obesity, PAD, falls, and suspected sleep apnea who is being seen today for the evaluation of chest pressure with elevated troponin and new onset A. fib with RVR at the request of Dr. Joylene Igo.  Assessment & Plan    1. New onset Afib with RVR: -Presented in A. fib with RVR with ventricular rates in the 150s bpm -Maintaining sinus rhythm following IV Cardizem in the ED, which has been stopped -Continue metoprolol  -CHADS2VASc at least 6 (HTN, age x 2, DM, vascular disease, sex category) -Heparin gtt for now until it is clear she will not need inpatient procedures -She should be transitioned to DOAC prior to discharge -Echo as above -Potassium and magnesium at goal -TSH normal -Needs treatment for presumed newly diagnosed sleep apnea   2.  CAD involving the native coronary status post multiple PCIs most recently in 07/2020 to the mid LAD with NSTEMI: -Currently chest pain-free -Symptoms feel similar to prior angina -Initial high-sensitivity troponin 24 with a delta troponin of 420, peaked at 2199, subsequently down trending -Heparin drip as above -ASA/Plavix for now, though will ultimately need to stop ASA to minimize bleeding risk given the expected initiation of DOAC prior to discharge  -[facility] Monday, 8/8 with 12 hours of IV hydration prior given underlying CKD and history of contrast-induced nephropathy (ordered to start at 8 PM tonight) -NPO at midnight 8/8 -Risks and benefits of cardiac catheterization have been discussed with the patient  including risks of bleeding, bruising, infection, kidney damage, stroke, heart attack, urgent need for bypass and death. The patient understands these risks and is willing to proceed with the procedure. All questions have been answered and concerns listened to   3. HTN: -Blood pressure remains mildly elevated -Titrate amlodipine to 10 mg -Did not tolerate higher doses of metoprolol in the past due to fatigue -Continue losartan   4. CKD stage III: -Avoid nephrotoxic agents -Planning for IV hydration pre-cath -Hold Lasix pre-cath   5. Anemia: -Uncertain baseline/etiology -Possibly of chronic disease -Stable -Monitor on heparin gtt -Per IM  6.  DM2 with associated peripheral neuropathy: -Hold metformin upon admission -SSI per IM -A1c 6.7   7.  Obesity with sleep disordered breathing: -She indicates prior to leaving [place] she underwent a sleep study and was told her results were abnormal with further details unclear -Weight loss is encouraged to heart healthy diet and regular exercise -She will need to follow-up as an outpatient for what appears to be newly diagnosed sleep apnea   8.  Falls: -She decayed she trips approximately once per year in the setting of peripheral neuropathy -Recommend PT/OT   9.  HLD: -LDL 113 with goal being < 70 -She reports she is intolerant to low intensity, medium intensity, and high intensity statins as well as to Zetia -She declines rechallenge of statin or Zetia -She will need a PCSK9i or bempedoic acid in the outpatient setting   10.  PAD: -She reports recent abnormal ABIs with plans to follow-up with vascular surgery in [place] -No critical limb ischemia   For questions or updates, please contact [facility] Please consult [email or web address] for contact info under Cardiology/STEMI.    Signed, Eula Listen, PA-C [facility] Pager: [phone] 12/28/2020, 7:39 AM

## 2020-12-28 NOTE — Progress Notes (Signed)
Take him to PROGRESS NOTE    Hannah Thomas  WUJ:[id number] DOB: 06-07-1945 DOA: 12/26/2020 PCP: Pcp, No    Brief Narrative:  Hannah Thomas is a 74 y.o. female with medical history significant for coronary artery disease status post PCI with stent angioplasty, hypertension, diabetes mellitus, dyslipidemia who presents to the emergency room for evaluation of chest pain. She presented for evaluation of midsternal chest pain that she described as pressure-like and a burning sensation that woke her up at about midnight.  She took 2 nitroglycerin pills with improvement in her symptoms and was able to go back to bed.  She awoke again at 5 AM with worsening chest pain and took 4 nitroglycerin tablets 5 minutes apart without any significant improvement in her symptoms.  Chest pain was nonradiating and was associated with diaphoresis and palpitations.  She denied having any nausea, no vomiting or shortness of breath.  Due to the persistence of her symptoms she decided to come to the emergency room to get checked.  Respiratory viral panel is negative  TP elevated  8/6 no chest pain this AM.  No anginal-like symptoms. 8/7- no cp. No overnight issues   Consultants:  Cardiology  Procedures: Echo  Antimicrobials:     Subjective: No sob . No complaints this am  Objective: Vitals:   12/27/20 1640 12/27/20 2026 12/28/20 0408 12/28/20 0728  BP: (!) 140/51 (!) 143/58 (!) 140/54 (!) 152/50  Pulse: 72 79 68 66  Resp: 19 20 18 18   Temp: 97.7 F (36.5 C) 97.7 F (36.5 C) 98 F (36.7 C) 97.9 F (36.6 C)  TempSrc:      SpO2: 99% 97% 96% 98%  Weight:      Height:        Intake/Output Summary (Last 24 hours) at 12/28/2020 0825 Last data filed at 12/27/2020 1700 Gross per 24 hour  Intake 198.72 ml  Output 100 ml  Net 98.72 ml   Filed Weights   12/26/20 0616  Weight: 108.9 kg    Examination: Nad, calm Cta no w/r Regular s1/s2 no gallop Soft benign +bs No edema aaxoxo4   Data Reviewed: I have  personally reviewed following labs and imaging studies  CBC: Recent Labs  Lab 12/26/20 0634 12/27/20 0630 12/28/20 0157  WBC 8.8 9.4 9.0  HGB 11.1* 11.0* 10.7*  HCT 32.8* 33.0* 32.7*  MCV 92.9 92.4 92.6  PLT 229 223 214   Basic Metabolic Panel: Recent Labs  Lab 12/26/20 0634 12/26/20 0826 12/27/20 0630  NA 140  --   --   K 4.1  --   --   CL 107  --   --   CO2 21*  --   --   GLUCOSE 195*  --   --   BUN 51*  --   --   CREATININE 1.65*  --  1.41*  CALCIUM 9.3  --   --   MG  --  2.0  --    GFR: Estimated Creatinine Clearance: 39.6 mL/min (A) (by C-G formula based on SCr of 1.41 mg/dL (H)). Liver Function Tests: Recent Labs  Lab 12/26/20 0634  AST 26  ALT 15  ALKPHOS 74  BILITOT 0.7  PROT 7.6  ALBUMIN 3.8   No results for input(s): LIPASE, AMYLASE in the last 168 hours. No results for input(s): AMMONIA in the last 168 hours. Coagulation Profile: Recent Labs  Lab 12/26/20 0922  INR 1.0   Cardiac Enzymes: No results for input(s): CKTOTAL, CKMB, CKMBINDEX, TROPONINI in  the last 168 hours. BNP (last 3 results) No results for input(s): PROBNP in the last 8760 hours. HbA1C: Recent Labs    12/26/20 1030  HGBA1C 6.7*   CBG: Recent Labs  Lab 12/27/20 0828 12/27/20 1140 12/27/20 1638 12/27/20 2026 12/28/20 0727  GLUCAP 74 92 136* 165* 122*   Lipid Profile: Recent Labs    12/27/20 0630  CHOL 216*  HDL 34*  LDLCALC 113*  TRIG 343*  CHOLHDL 6.4   Thyroid Function Tests: Recent Labs    12/26/20 0634  TSH 1.571  FREET4 0.90   Anemia Panel: No results for input(s): VITAMINB12, FOLATE, FERRITIN, TIBC, IRON, RETICCTPCT in the last 72 hours. Sepsis Labs: No results for input(s): PROCALCITON, LATICACIDVEN in the last 168 hours.  Recent Results (from the past 240 hour(s))  Resp Panel by RT-PCR (Flu A&B, Covid) Nasopharyngeal Swab     Status: None   Collection Time: 12/26/20  6:41 AM   Specimen: Nasopharyngeal Swab; Nasopharyngeal(NP) swabs in vial  transport medium  Result Value Ref Range Status   SARS Coronavirus 2 by RT PCR NEGATIVE NEGATIVE Final    Comment: (NOTE) SARS-CoV-2 target nucleic acids are NOT DETECTED.  The SARS-CoV-2 RNA is generally detectable in upper respiratory specimens during the acute phase of infection. The lowest concentration of SARS-CoV-2 viral copies this assay can detect is 138 copies/mL. A negative result does not preclude SARS-Cov-2 infection and should not be used as the sole basis for treatment or other patient management decisions. A negative result may occur with  improper specimen collection/handling, submission of specimen other than nasopharyngeal swab, presence of viral mutation(s) within the areas targeted by this assay, and inadequate number of viral copies(<138 copies/mL). A negative result must be combined with clinical observations, patient history, and epidemiological information. The expected result is Negative.  Fact Sheet for Patients:  BloggerCourse.com  Fact Sheet for Healthcare Providers:  SeriousBroker.it  This test is no t yet approved or cleared by the [place] FDA and  has been authorized for detection and/or diagnosis of SARS-CoV-2 by FDA under an Emergency Use Authorization (EUA). This EUA will remain  in effect (meaning this test can be used) for the duration of the COVID-19 declaration under Section 564(b)(1) of the Act, 21 U.S.C.section 360bbb-3(b)(1), unless the authorization is terminated  or revoked sooner.       Influenza A by PCR NEGATIVE NEGATIVE Final   Influenza B by PCR NEGATIVE NEGATIVE Final    Comment: (NOTE) The Xpert Xpress SARS-CoV-2/FLU/RSV plus assay is intended as an aid in the diagnosis of influenza from Nasopharyngeal swab specimens and should not be used as a sole basis for treatment. Nasal washings and aspirates are unacceptable for Xpert Xpress SARS-CoV-2/FLU/RSV testing.  Fact  Sheet for Patients: BloggerCourse.com  Fact Sheet for Healthcare Providers: SeriousBroker.it  This test is not yet approved or cleared by the [place] FDA and has been authorized for detection and/or diagnosis of SARS-CoV-2 by FDA under an Emergency Use Authorization (EUA). This EUA will remain in effect (meaning this test can be used) for the duration of the COVID-19 declaration under Section 564(b)(1) of the Act, 21 U.S.C. section 360bbb-3(b)(1), unless the authorization is terminated or revoked.  Performed at [facility], [address] [place], [place] [address]          Radiology Studies: ECHOCARDIOGRAM COMPLETE  Result Date: 12/26/2020    ECHOCARDIOGRAM REPORT   Patient Name:   Hannah Thomas  Date of Exam: 12/26/2020 Medical Rec #:  [id number]  Height:       63.0 in Accession #:    [id number] Weight:       240.0 lb Date of Birth:  03/27/1946   BSA:          2.089 m Patient Age:    75 years   BP:           172/55 mmHg Patient Gender: F          HR:           73 bpm. Exam Location:  [facility] Procedure: 2D Echo, Cardiac Doppler and Color Doppler Indications:     Elevated Troponin  History:         Patient has no prior history of Echocardiogram examinations.                  CAD; Risk Factors:Diabetes.  Sonographer:     Cristela Blue RDCS (AE) Referring Phys:  [id number] Sondra Barges Diagnosing Phys: Lorine Bears MD  Sonographer Comments: Suboptimal apical window. IMPRESSIONS  1. Left ventricular ejection fraction, by estimation, is 55 to 60%. The left ventricle has normal function. The left ventricle demonstrates regional wall motion abnormalities (see scoring diagram/findings for description). There is mild left ventricular  hypertrophy. Left ventricular diastolic parameters were normal. There is mild hypokinesis of the left ventricular, basal-mid inferior wall.  2. Right ventricular systolic function is normal. The right ventricular size  is normal. Tricuspid regurgitation signal is inadequate for assessing PA pressure.  3. The mitral valve is normal in structure. Mild mitral valve regurgitation. No evidence of mitral stenosis.  4. The aortic valve is normal in structure. Aortic valve regurgitation is not visualized. Mild to moderate aortic valve sclerosis/calcification is present, without any evidence of aortic stenosis. FINDINGS  Left Ventricle: Left ventricular ejection fraction, by estimation, is 55 to 60%. The left ventricle has normal function. The left ventricle demonstrates regional wall motion abnormalities. Mild hypokinesis of the left ventricular, basal-mid inferior wall. The left ventricular internal cavity size was normal in size. There is mild left ventricular hypertrophy. Left ventricular diastolic parameters were normal. Right Ventricle: The right ventricular size is normal. No increase in right ventricular wall thickness. Right ventricular systolic function is normal. Tricuspid regurgitation signal is inadequate for assessing PA pressure. Left Atrium: Left atrial size was normal in size. Right Atrium: Right atrial size was normal in size. Pericardium: There is no evidence of pericardial effusion. Mitral Valve: The mitral valve is normal in structure. Mild mitral annular calcification. Mild mitral valve regurgitation. No evidence of mitral valve stenosis. Tricuspid Valve: The tricuspid valve is normal in structure. Tricuspid valve regurgitation is not demonstrated. No evidence of tricuspid stenosis. Aortic Valve: The aortic valve is normal in structure. Aortic valve regurgitation is not visualized. Mild to moderate aortic valve sclerosis/calcification is present, without any evidence of aortic stenosis. Aortic valve mean gradient measures 5.0 mmHg. Aortic valve peak gradient measures 8.8 mmHg. Aortic valve area, by VTI measures 2.03 cm. Pulmonic Valve: The pulmonic valve was normal in structure. Pulmonic valve regurgitation is not  visualized. No evidence of pulmonic stenosis. Aorta: The aortic root is normal in size and structure. Venous: The inferior vena cava was not well visualized. IAS/Shunts: No atrial level shunt detected by color flow Doppler.  LEFT VENTRICLE PLAX 2D LVIDd:         5.21 cm  Diastology LVIDs:         3.39 cm  LV e' medial:    6.96  cm/s LV PW:         1.03 cm  LV E/e' medial:  14.7 LV IVS:        1.30 cm  LV e' lateral:   9.36 cm/s LVOT diam:     2.10 cm  LV E/e' lateral: 10.9 LV SV:         67 LV SV Index:   32 LVOT Area:     3.46 cm  LEFT ATRIUM             Index       RIGHT ATRIUM           Index LA diam:        3.30 cm 1.58 cm/m  RA Area:     12.20 cm LA Vol (A2C):   49.2 ml 23.55 ml/m RA Volume:   24.10 ml  11.53 ml/m LA Vol (A4C):   38.8 ml 18.57 ml/m LA Biplane Vol: 43.6 ml 20.87 ml/m  AORTIC VALVE                    PULMONIC VALVE AV Area (Vmax):    2.08 cm     PV Vmax:        0.60 m/s AV Area (Vmean):   1.86 cm     PV Peak grad:   1.4 mmHg AV Area (VTI):     2.03 cm     RVOT Peak grad: 3 mmHg AV Vmax:           148.67 cm/s AV Vmean:          106.800 cm/s AV VTI:            0.328 m AV Peak Grad:      8.8 mmHg AV Mean Grad:      5.0 mmHg LVOT Vmax:         89.40 cm/s LVOT Vmean:        57.200 cm/s LVOT VTI:          0.192 m LVOT/AV VTI ratio: 0.59  AORTA Ao Root diam: 2.93 cm MITRAL VALVE                TRICUSPID VALVE MV Area (PHT): 5.58 cm     TR Peak grad:   10.5 mmHg MV Decel Time: 136 msec     TR Vmax:        162.00 cm/s MV E velocity: 102.00 cm/s MV A velocity: 85.30 cm/s   SHUNTS MV E/A ratio:  1.20         Systemic VTI:  0.19 m                             Systemic Diam: 2.10 cm Lorine BearsMuhammad Arida MD Electronically signed by Lorine BearsMuhammad Arida MD Signature Date/Time: 12/26/2020/3:21:03 PM    Final         Scheduled Meds:  amLODipine  10 mg Oral Daily   aspirin EC  81 mg Oral Daily   clopidogrel  75 mg Oral Daily   insulin aspart  0-20 Units Subcutaneous TID WC   losartan  50 mg Oral Daily    metoprolol succinate  50 mg Oral QHS   nitroGLYCERIN  0.2 mg Transdermal Daily   sodium chloride flush  3 mL Intravenous Q12H   Continuous Infusions:  sodium chloride     heparin 1,700 Units/hr (12/28/20 0600)    Assessment & Plan:   Principal Problem:  Atrial fibrillation with rapid ventricular response (HCC) Active Problems:   CAD in native artery   CKD stage 3 due to type 2 diabetes mellitus (HCC)   Obesity   Essential hypertension   NSTEMI (non-ST elevated myocardial infarction) (HCC)   Atrial fibrillation with RVR (HCC)   Atrial fibrillation with rapid ventricular response (Paroxysmal) Patient presented to the ER for evaluation of chest pain and was found to be in rapid atrial fibrillation. Received a dose of Cardizem 15 mg IV x1 and converted back to sinus rhythm Her CHA2DS2-VASc score is 5 8/7 in NSR Continue Toprol xl Continue heparin gtt Plan to start NOAC after procedure on Monday      Non-ST elevation MI History of CAD/PCI: Patient has a history of coronary artery disease and is status post stent angioplasty 8/7 troponins were elevated Cardiology following Plan for cath on Monday LDL goal less than 70 Continue Lipitor 80 daily Echo with wall motion abnormality and normal EF Continue aspirin, Plavix, heparin drip and beta-blockers      Diabetes mellitus with complications of stage III chronic kidney disease BG stable Continue R-ISS Monitor renal function       Hypertension Continue metoprolol, amlodipine, Cozaar  If BP not well controlled can increase amlodipine          Morbid obesity (BMI 42) Complicates overall prognosis and care   DVT prophylaxis: Heparin drip Code Status: Full Family Communication: None at bedside Disposition Plan:  Status is: Observation  The patient will require care spanning > 2 midnights and should be moved to inpatient because: Inpatient level of care appropriate due to severity of illness  Dispo: The  patient is from: Home              Anticipated d/c is to: Home              Patient currently is not medically stable to d/c.   Difficult to place patient No    LOC: 2-3 days. Plan for cath on monday        LOS: 1 day   Time spent: 35 minutes with more than 50% on COC he    Lynn Ito, MD [facility] Pager [phone]  If 7PM-7AM, please contact night-coverage 12/28/2020, 8:25 AM

## 2020-12-28 NOTE — Consult Note (Signed)
ANTICOAGULATION CONSULT NOTE  Pharmacy Consult for heparin Indication: chest pain/ACS and new onset AFib  Allergies  Allergen Reactions   Erythromycin Other (See Comments)   Allopurinol Diarrhea   Ezetimibe Diarrhea   Morphine And Related Rash    Patient Measurements: Height: 5\' 3"  (160 cm) Weight: 108.9 kg (240 lb) IBW/kg (Calculated) : 52.4 Heparin Dosing Weight: 78.5kg  Vital Signs: Temp: 97.7 F (36.5 C) (08/06 2026) BP: 143/58 (08/06 2026) Pulse Rate: 79 (08/06 2026)  Labs: Recent Labs    12/26/20 0634 12/26/20 0826 12/26/20 0922 12/26/20 1030 12/26/20 1434 12/26/20 2038 12/27/20 0630 12/27/20 0911 12/27/20 1737 12/28/20 0157  HGB 11.1*  --   --   --   --   --  11.0*  --   --  10.7*  HCT 32.8*  --   --   --   --   --  33.0*  --   --  32.7*  PLT 229  --   --   --   --   --  223  --   --  214  APTT  --   --  29  --   --   --   --   --   --   --   LABPROT  --   --  13.0  --   --   --   --   --   --   --   INR  --   --  1.0  --   --   --   --   --   --   --   HEPARINUNFRC  --   --   --   --   --    < > 0.10*  --  0.45 0.40  CREATININE 1.65*  --   --   --   --   --  1.41*  --   --   --   TROPONINIHS 24*   < >  --  1,311* 2,199*  --   --  1,330*  --   --    < > = values in this interval not displayed.     Estimated Creatinine Clearance: 39.6 mL/min (A) (by C-G formula based on SCr of 1.41 mg/dL (H)).   Medical History: Past Medical History:  Diagnosis Date   Anemia    CAD in native artery    a. MI ~ 2000 s/p PCI x 2; b. PCI ~ 2015; c. PCI ~ 2020 complicated vy contrast-induced nephropathy; d. PCI 07/2020   CKD (chronic kidney disease)    DM2 (diabetes mellitus, type 2) (HCC)    Essential hypertension    Falls    Hyperlipidemia LDL goal <70    Obesity    Peripheral neuropathy       Assessment: Hannah Thomas is a 75yo female presenting with chest pain, new onset atrial fibrilation (CHA2DS2-VASc=6), and rapid ventricular rate. Cardiology is following.  Pharmacy was consulted for heparin initiation for ACS and new onset AFib.  The PTA medication list does not include a DOAC.  0806 1737 HL 0.45 therapeutic x 1  Goal of Therapy:  Heparin level 0.3-0.7 units/ml aPTT 66-102 seconds Monitor platelets by anticoagulation protocol: Yes   Plan:  8/7:  HL @ 0157 = 0.40, therapeutic X 2  Will continue pt on current rate and recheck HL on 8/8 with AM labs.   10/8, PharmD Clinical Pharmacist 12/28/2020 2:55 AM

## 2020-12-28 NOTE — Progress Notes (Signed)
Pt is A&O, VS stable, NSR On the monitor. IVx2 in place. No complaints of pain or discomfort tonight. Slept well. Heparin at 17/hr. Plan for [facility] Monday

## 2020-12-28 NOTE — Plan of Care (Signed)
  Problem: Education: Goal: Knowledge of General Education information will improve Description: Including pain rating scale, medication(s)/side effects and non-pharmacologic comfort measures 12/28/2020 1209 by Ansel Bong, RN Outcome: Progressing 12/28/2020 1209 by Ansel Bong, RN Outcome: Progressing   Problem: Health Behavior/Discharge Planning: Goal: Ability to manage health-related needs will improve 12/28/2020 1209 by Ansel Bong, RN Outcome: Progressing 12/28/2020 1209 by Ansel Bong, RN Outcome: Progressing   Problem: Clinical Measurements: Goal: Ability to maintain clinical measurements within normal limits will improve 12/28/2020 1209 by Ansel Bong, RN Outcome: Progressing 12/28/2020 1209 by Ansel Bong, RN Outcome: Progressing Goal: Will remain free from infection 12/28/2020 1209 by Ansel Bong, RN Outcome: Progressing 12/28/2020 1209 by Ansel Bong, RN Outcome: Progressing Goal: Diagnostic test results will improve 12/28/2020 1209 by Ansel Bong, RN Outcome: Progressing 12/28/2020 1209 by Ansel Bong, RN Outcome: Progressing Goal: Respiratory complications will improve 12/28/2020 1209 by Ansel Bong, RN Outcome: Progressing 12/28/2020 1209 by Ansel Bong, RN Outcome: Progressing Goal: Cardiovascular complication will be avoided 12/28/2020 1209 by Ansel Bong, RN Outcome: Progressing 12/28/2020 1209 by Ansel Bong, RN Outcome: Progressing   Problem: Activity: Goal: Risk for activity intolerance will decrease 12/28/2020 1209 by Ansel Bong, RN Outcome: Progressing 12/28/2020 1209 by Ansel Bong, RN Outcome: Progressing   Problem: Nutrition: Goal: Adequate nutrition will be maintained 12/28/2020 1209 by Ansel Bong, RN Outcome: Progressing 12/28/2020 1209 by Ansel Bong, RN Outcome: Progressing   Problem: Coping: Goal: Level of anxiety will decrease 12/28/2020 1209 by Ansel Bong, RN Outcome: Progressing 12/28/2020 1209 by  Ansel Bong, RN Outcome: Progressing   Problem: Elimination: Goal: Will not experience complications related to bowel motility 12/28/2020 1209 by Ansel Bong, RN Outcome: Progressing 12/28/2020 1209 by Ansel Bong, RN Outcome: Progressing Goal: Will not experience complications related to urinary retention 12/28/2020 1209 by Ansel Bong, RN Outcome: Progressing 12/28/2020 1209 by Ansel Bong, RN Outcome: Progressing   Problem: Pain Managment: Goal: General experience of comfort will improve 12/28/2020 1209 by Ansel Bong, RN Outcome: Progressing 12/28/2020 1209 by Ansel Bong, RN Outcome: Progressing   Problem: Safety: Goal: Ability to remain free from injury will improve 12/28/2020 1209 by Ansel Bong, RN Outcome: Progressing 12/28/2020 1209 by Ansel Bong, RN Outcome: Progressing   Problem: Skin Integrity: Goal: Risk for impaired skin integrity will decrease 12/28/2020 1209 by Ansel Bong, RN Outcome: Progressing 12/28/2020 1209 by Ansel Bong, RN Outcome: Progressing

## 2020-12-29 ENCOUNTER — Encounter
Admission: EM | Disposition: A | Discharge status: Home / Self Care | Payer: Self-pay | Source: Home / Self Care | Attending: Internal Medicine

## 2020-12-29 ENCOUNTER — Telehealth: Payer: Self-pay

## 2020-12-29 DIAGNOSIS — I2510 Atherosclerotic heart disease of native coronary artery without angina pectoris: Secondary | ICD-10-CM | POA: Diagnosis not present

## 2020-12-29 DIAGNOSIS — I4891 Unspecified atrial fibrillation: Secondary | ICD-10-CM | POA: Diagnosis not present

## 2020-12-29 DIAGNOSIS — I214 Non-ST elevation (NSTEMI) myocardial infarction: Secondary | ICD-10-CM | POA: Diagnosis not present

## 2020-12-29 HISTORY — PX: LEFT HEART CATH AND CORONARY ANGIOGRAPHY: CATH118249

## 2020-12-29 LAB — CBC
HCT: 32.6 % — ABNORMAL LOW (ref 36.0–46.0)
Hemoglobin: 10.5 g/dL — ABNORMAL LOW (ref 12.0–15.0)
MCH: 30.2 pg (ref 26.0–34.0)
MCHC: 32.2 g/dL (ref 30.0–36.0)
MCV: 93.7 fL (ref 80.0–100.0)
Platelets: 197 10*3/uL (ref 150–400)
RBC: 3.48 MIL/uL — ABNORMAL LOW (ref 3.87–5.11)
RDW: 13.6 % (ref 11.5–15.5)
WBC: 7.7 10*3/uL (ref 4.0–10.5)
nRBC: 0 % (ref 0.0–0.2)

## 2020-12-29 LAB — GLUCOSE, CAPILLARY
Glucose-Capillary: 163 mg/dL — ABNORMAL HIGH (ref 70–99)
Glucose-Capillary: 169 mg/dL — ABNORMAL HIGH (ref 70–99)
Glucose-Capillary: 147 mg/dL — ABNORMAL HIGH (ref 70–99)
Glucose-Capillary: 195 mg/dL — ABNORMAL HIGH (ref 70–99)

## 2020-12-29 LAB — BASIC METABOLIC PANEL
Anion gap: 7 (ref 5–15)
BUN: 37 mg/dL — ABNORMAL HIGH (ref 8–23)
CO2: 26 mmol/L (ref 22–32)
Calcium: 9.2 mg/dL (ref 8.9–10.3)
Chloride: 108 mmol/L (ref 98–111)
Creatinine, Ser: 1.4 mg/dL — ABNORMAL HIGH (ref 0.44–1.00)
GFR, Estimated: 39 mL/min — ABNORMAL LOW (ref >60)
Glucose, Bld: 177 mg/dL — ABNORMAL HIGH (ref 70–99)
Potassium: 4.1 mmol/L (ref 3.5–5.1)
Sodium: 141 mmol/L (ref 135–145)

## 2020-12-29 LAB — HEPARIN LEVEL (UNFRACTIONATED): Heparin Unfractionated: 0.31 IU/mL (ref 0.30–0.70)

## 2020-12-29 SURGERY — LEFT HEART CATH AND CORONARY ANGIOGRAPHY
Anesthesia: Moderate Sedation

## 2020-12-29 MED ORDER — SODIUM CHLORIDE 0.9 % IV SOLN: 250.0000 mL | INTRAVENOUS | Status: DC | PRN

## 2020-12-29 MED ORDER — EZETIMIBE 10 MG PO TABS: 10.0000 mg | ORAL_TABLET | Freq: Every day | ORAL | 0 refills | Status: DC

## 2020-12-29 MED ORDER — LIDOCAINE HCL (PF) 1 % IJ SOLN
INTRAMUSCULAR | Status: DC | PRN
  Administered 2020-12-29: 2 mL

## 2020-12-29 MED ORDER — HEPARIN (PORCINE) IN NACL 2000-0.9 UNIT/L-% IV SOLN
INTRAVENOUS | Status: DC | PRN
  Administered 2020-12-29: 1000 mL

## 2020-12-29 MED ORDER — MIDAZOLAM HCL 2 MG/2ML IJ SOLN
INTRAMUSCULAR | Status: AC
  Filled 2020-12-29: qty 2

## 2020-12-29 MED ORDER — FENTANYL CITRATE (PF) 100 MCG/2ML IJ SOLN
INTRAMUSCULAR | Status: AC
  Filled 2020-12-29: qty 2

## 2020-12-29 MED ORDER — LABETALOL HCL 5 MG/ML IV SOLN: 10.0000 mg | INTRAVENOUS | Status: DC | PRN

## 2020-12-29 MED ORDER — SODIUM CHLORIDE 0.9 % IV SOLN: INTRAVENOUS | Status: AC

## 2020-12-29 MED ORDER — FENTANYL CITRATE (PF) 100 MCG/2ML IJ SOLN
INTRAMUSCULAR | Status: DC | PRN
  Administered 2020-12-29: 25 ug via INTRAVENOUS

## 2020-12-29 MED ORDER — SODIUM CHLORIDE 0.9% FLUSH: 3.0000 mL | INTRAVENOUS | Status: DC | PRN

## 2020-12-29 MED ORDER — SODIUM CHLORIDE 0.9% FLUSH: 3.0000 mL | Freq: Two times a day (BID) | INTRAVENOUS | Status: DC

## 2020-12-29 MED ORDER — LOSARTAN POTASSIUM 100 MG PO TABS: 100.0000 mg | ORAL_TABLET | Freq: Every day | ORAL | 0 refills | Status: DC

## 2020-12-29 MED ORDER — VERAPAMIL HCL 2.5 MG/ML IV SOLN
INTRAVENOUS | Status: DC | PRN
  Administered 2020-12-29: 2.5 mg via INTRA_ARTERIAL

## 2020-12-29 MED ORDER — MIDAZOLAM HCL 2 MG/2ML IJ SOLN
INTRAMUSCULAR | Status: DC | PRN
  Administered 2020-12-29: 1 mg via INTRAVENOUS

## 2020-12-29 MED ORDER — HEPARIN SODIUM (PORCINE) 1000 UNIT/ML IJ SOLN
INTRAMUSCULAR | Status: AC
  Filled 2020-12-29: qty 1

## 2020-12-29 MED ORDER — IOHEXOL 300 MG/ML  SOLN
INTRAMUSCULAR | Status: DC | PRN
  Administered 2020-12-29: 55 mL

## 2020-12-29 MED ORDER — HEPARIN (PORCINE) IN NACL 1000-0.9 UT/500ML-% IV SOLN
INTRAVENOUS | Status: AC
  Filled 2020-12-29: qty 1000

## 2020-12-29 MED ORDER — VERAPAMIL HCL 2.5 MG/ML IV SOLN
INTRAVENOUS | Status: AC
  Filled 2020-12-29: qty 2

## 2020-12-29 MED ORDER — AMLODIPINE BESYLATE 10 MG PO TABS: 10.0000 mg | ORAL_TABLET | Freq: Every day | ORAL | 0 refills | Status: DC

## 2020-12-29 MED ORDER — LOSARTAN POTASSIUM 50 MG PO TABS
100.0000 mg | ORAL_TABLET | Freq: Every day | ORAL | Status: DC
  Filled 2020-12-29: qty 2

## 2020-12-29 MED ORDER — APIXABAN 5 MG PO TABS: 5.0000 mg | ORAL_TABLET | Freq: Two times a day (BID) | ORAL | 0 refills | Status: DC

## 2020-12-29 MED ORDER — HEPARIN SODIUM (PORCINE) 1000 UNIT/ML IJ SOLN
INTRAMUSCULAR | Status: DC | PRN
  Administered 2020-12-29: 5000 [IU] via INTRAVENOUS

## 2020-12-29 MED ORDER — LIDOCAINE HCL (PF) 1 % IJ SOLN
INTRAMUSCULAR | Status: AC
  Filled 2020-12-29: qty 30

## 2020-12-29 SURGICAL SUPPLY — 12 items
CATH INFINITI 5 FR JL3.5 (CATHETERS) ×2 IMPLANT
CATH INFINITI 5FR JK (CATHETERS) ×2 IMPLANT
CATH INFINITI JR4 5F (CATHETERS) ×2 IMPLANT
DEVICE RAD TR BAND REGULAR (VASCULAR PRODUCTS) ×2 IMPLANT
DRAPE BRACHIAL (DRAPES) ×2 IMPLANT
GLIDESHEATH SLEND SS 6F .021 (SHEATH) ×2 IMPLANT
GUIDEWIRE INQWIRE 1.5J.035X260 (WIRE) ×1 IMPLANT
INQWIRE 1.5J .035X260CM (WIRE) ×2
PACK CARDIAC CATH (CUSTOM PROCEDURE TRAY) ×2 IMPLANT
PROTECTION STATION PRESSURIZED (MISCELLANEOUS) ×2
SET ATX SIMPLICITY (MISCELLANEOUS) ×2 IMPLANT
STATION PROTECTION PRESSURIZED (MISCELLANEOUS) ×1 IMPLANT

## 2020-12-29 NOTE — Discharge Summary (Signed)
Hannah Thomas AST:[id number] DOB: 29-Oct-1945 DOA: 12/26/2020  PCP: Pcp, No  Admit date: 12/26/2020 Discharge date: 12/29/2020  Admitted From: Home Disposition: Home  Recommendations for Outpatient Follow-up:  Follow up with PCP in 1 week Please obtain BMP/CBC in one week Please follow up with cardiology in 1 week    Discharge Condition:Stable CODE STATUS: Full Diet recommendation: Heart Healthy / Carb Modified  Brief/Interim Summary: Per Hannah Thomas is a 75 y.o. female with medical history significant for coronary artery disease status post PCI with stent angioplasty, hypertension, diabetes mellitus, dyslipidemia who presents to the emergency room for evaluation of chest pain.  She was found with elevated troponins.  She was also found with A. fib with RVR.  Cardiology was consulted.  Respiratory viral panel was negative.  Patient was admitted for further evaluation.   Atrial fibrillation with rapid ventricular response (Paroxysmal) Patient presented to the ER for evaluation of chest pain and was found to be in rapid atrial fibrillation. Received a dose of Cardizem 15 mg IV x1 and converted back to sinus rhythm Her CHA2DS2-VASc score is 5 Remains in sinus rhythm. Continued on metoprolol.  And cardiology recommended discharging her on NOAC.        Non-ST elevation MI History of CAD/PCI: Patient has a history of coronary artery disease and is status post stent angioplasty Patient was found with elevated troponin.  Cardiology was consulted.  Status post cardiac cath which did not require any intervention.  It was suspected that her elevated troponin was due to supply demand ischemia relatively to chronically occluded left circumflex in the setting of A. fib RVR.  And no revascularization was indicated. Echo with wall motion abnormality and normal EF. She was going to be started on Eliquis 5 mg twice daily and Plavix 75 mg daily.  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  Instructions about metformin was given to her that she should hold off for Hannah Thomas to clear her about resuming this after her labs are completed and her renal function is stable.       Hypertension Continue blood pressure meds. Her Losartan was increased by cardiology           Morbid obesity (BMI 42) Complicates overall prognosis and care    Discharge Diagnoses:  Principal Problem:   Atrial fibrillation with rapid ventricular response (HCC) Active Problems:   CAD in native artery   CKD stage 3 due to type 2 diabetes mellitus (HCC)   Obesity   Essential hypertension   NSTEMI (non-ST elevated myocardial infarction) (HCC)   Atrial fibrillation with RVR [facility])    Discharge Instructions  Discharge Instructions     Call MD for:  difficulty breathing, headache or visual disturbances   Complete by: As directed    Diet - low sodium heart healthy   Complete by: As directed    Diet Carb Modified   Complete by: As directed    Discharge instructions   Complete by: As directed    Start metformin post cath tomorrow. Hold lasix for 3 days . Hydrate well post cath Start losartan tomorrow Start Eliquis tomorrow unless ophthalmology does not want you to take it then start when cleared by your ophthalmology post eye procedure.   Increase activity slowly   Complete by: As directed       Allergies as of 12/29/2020       Reactions  Erythromycin Other (See Comments)   Allopurinol Diarrhea   Ezetimibe Diarrhea   Morphine And Related Rash        Medication List     STOP taking these medications    furosemide 20 MG tablet Commonly known as: LASIX       TAKE these medications    acidophilus Caps capsule Take 1 capsule by mouth daily.   amLODipine 10 MG tablet Commonly known as: NORVASC Take 1 tablet (10 mg total) by mouth daily. What changed:  medication strength how much to  take   apixaban 5 MG Tabs tablet Commonly known as: ELIQUIS Take 1 tablet (5 mg total) by mouth 2 (two) times daily. Start taking on: December 31, 2020   clopidogrel 75 MG tablet Commonly known as: PLAVIX Take 75 mg by mouth daily.   ezetimibe 10 MG tablet Commonly known as: ZETIA Take 1 tablet (10 mg total) by mouth daily. Start taking on: December 30, 2020   insulin glargine 100 UNIT/ML Solostar Pen Commonly known as: LANTUS Inject 60 Units into the skin at bedtime.   linagliptin 5 MG Tabs tablet Commonly known as: TRADJENTA Take 5 mg by mouth daily.   losartan 100 MG tablet Commonly known as: COZAAR Take 1 tablet (100 mg total) by mouth daily. Start taking on: December 30, 2020 What changed:  medication strength how much to take   metFORMIN 1000 MG tablet Commonly known as: GLUCOPHAGE Take 1,000 mg by mouth 2 (two) times daily.   metoprolol succinate 100 MG 24 hr tablet Commonly known as: TOPROL-XL Take 50 mg by mouth at bedtime.   nitroGLYCERIN 0.2 mg/hr patch Commonly known as: NITRODUR - Dosed in mg/24 hr Place 0.2 mg onto the skin daily.   nitroGLYCERIN 0.4 MG SL tablet Commonly known as: NITROSTAT Place 0.4 mg under the tongue every 5 (five) minutes x 3 doses as needed for chest pain.        Follow-up Information     Iran Ouch, MD Follow up in 1 week(s).   Specialty: Cardiology Contact information: [address] [place] [address] [phone]                Allergies  Allergen Reactions   Erythromycin Other (See Comments)   Allopurinol Diarrhea   Ezetimibe Diarrhea   Morphine And Related Rash    Consultations: Cardiology   Procedures/Studies: CARDIAC CATHETERIZATION  Result Date: 12/29/2020 Formatting of this result is different from the original.   Prox RCA to Mid RCA lesion is 10% stenosed.   Dist RCA lesion is 20% stenosed.   RPAV lesion is 10% stenosed.   Ost LAD to Prox LAD lesion is 20% stenosed.    Dist LM lesion is 30% stenosed.   Prox Cx to Mid Cx lesion is 100% stenosed.   Ost Cx to Prox Cx lesion is 95% stenosed with 95% stenosed side branch in 1st Mrg.   Previously placed Mid LAD stent (unknown type) is  widely patent. 1.  Significant underlying three-vessel coronary artery disease with patent stents in the LAD and right coronary artery with minimal restenosis.  Chronically occluded left circumflex with right to left and left to left collaterals 2.  Left ventricular angiography was not performed.  EF was normal by echo. 3.  Mildly elevated left ventricular end-diastolic pressure. Recommendations: Suspect that the patient had elevated troponin due to supply demand ischemia related to chronically occluded left circumflex in the setting of A. fib with RVR.  No revascularization is indicated. Continue medical therapy. Start anticoagulation with Eliquis 5 mg twice daily tomorrow to be used with clopidogrel 75 mg once daily.  Discontinue aspirin. I increase losartan to 100 mg daily for blood pressure control. The patient can likely be discharged home later today.   DG Chest Port 1 View  Result Date: 12/26/2020 CLINICAL DATA:  75 year old female with history of chest tightness and burning. EXAM: PORTABLE CHEST 1 VIEW COMPARISON:  No priors. FINDINGS: Lung volumes are slightly low. Linear areas of scarring or subsegmental atelectasis are noted throughout the left mid to lower lung. No acute consolidative airspace disease. No pleural effusions. No pneumothorax. No evidence of pulmonary edema. Heart size is borderline enlarged accentuated by low lung volumes and portable AP technique. Upper mediastinal contours are within normal limits. Atherosclerotic calcifications in the arch of the aorta. IMPRESSION: 1. Low lung volumes without definite radiographic evidence of acute cardiopulmonary disease. 2. Mild linear scarring or subsegmental atelectasis in the left mid to lower lung. 3. Aortic atherosclerosis.  Electronically Signed   By: Trudie Reed M.D.   On: 12/26/2020 07:04   ECHOCARDIOGRAM COMPLETE  Result Date: 12/26/2020    ECHOCARDIOGRAM REPORT   Patient Name:   Hannah Thomas  Date of Exam: 12/26/2020 Medical Rec #:  [id number]  Height:       63.0 in Accession #:    [id number] Weight:       240.0 lb Date of Birth:  05/07/1946   BSA:          2.089 m Patient Age:    75 years   BP:           172/55 mmHg Patient Gender: F          HR:           73 bpm. Exam Location:  [facility] Procedure: 2D Echo, Cardiac Doppler and Color Doppler Indications:     Elevated Troponin  History:         Patient has no prior history of Echocardiogram examinations.                  CAD; Risk Factors:Diabetes.  Sonographer:     Cristela Blue RDCS (AE) Referring Phys:  [id number] Sondra Barges Diagnosing Phys: Lorine Bears MD  Sonographer Comments: Suboptimal apical window. IMPRESSIONS  1. Left ventricular ejection fraction, by estimation, is 55 to 60%. The left ventricle has normal function. The left ventricle demonstrates regional wall motion abnormalities (see scoring diagram/findings for description). There is mild left ventricular  hypertrophy. Left ventricular diastolic parameters were normal. There is mild hypokinesis of the left ventricular, basal-mid inferior wall.  2. Right ventricular systolic function is normal. The right ventricular size is normal. Tricuspid regurgitation signal is inadequate for assessing PA pressure.  3. The mitral valve is normal in structure. Mild mitral valve regurgitation. No evidence of mitral stenosis.  4. The aortic valve is normal in structure. Aortic valve regurgitation is not visualized. Mild to moderate aortic valve sclerosis/calcification is present, without any evidence of aortic stenosis. FINDINGS  Left Ventricle: Left ventricular ejection fraction, by estimation, is 55 to 60%. The left ventricle has normal function. The left ventricle demonstrates regional wall motion abnormalities. Mild hypokinesis of the  left ventricular, basal-mid inferior wall. The left ventricular internal cavity size was normal in size. There is mild left ventricular hypertrophy. Left ventricular diastolic parameters were normal. Right Ventricle: The right ventricular size is normal. No increase in right ventricular wall thickness. Right  ventricular systolic function is normal. Tricuspid regurgitation signal is inadequate for assessing PA pressure. Left Atrium: Left atrial size was normal in size. Right Atrium: Right atrial size was normal in size. Pericardium: There is no evidence of pericardial effusion. Mitral Valve: The mitral valve is normal in structure. Mild mitral annular calcification. Mild mitral valve regurgitation. No evidence of mitral valve stenosis. Tricuspid Valve: The tricuspid valve is normal in structure. Tricuspid valve regurgitation is not demonstrated. No evidence of tricuspid stenosis. Aortic Valve: The aortic valve is normal in structure. Aortic valve regurgitation is not visualized. Mild to moderate aortic valve sclerosis/calcification is present, without any evidence of aortic stenosis. Aortic valve mean gradient measures 5.0 mmHg. Aortic valve peak gradient measures 8.8 mmHg. Aortic valve area, by VTI measures 2.03 cm. Pulmonic Valve: The pulmonic valve was normal in structure. Pulmonic valve regurgitation is not visualized. No evidence of pulmonic stenosis. Aorta: The aortic root is normal in size and structure. Venous: The inferior vena cava was not well visualized. IAS/Shunts: No atrial level shunt detected by color flow Doppler.  LEFT VENTRICLE PLAX 2D LVIDd:         5.21 cm  Diastology LVIDs:         3.39 cm  LV e' medial:    6.96 cm/s LV PW:         1.03 cm  LV E/e' medial:  14.7 LV IVS:        1.30 cm  LV e' lateral:   9.36 cm/s LVOT diam:     2.10 cm  LV E/e' lateral: 10.9 LV SV:         67 LV SV Index:   32 LVOT Area:     3.46 cm  LEFT ATRIUM             Index       RIGHT ATRIUM           Index LA diam:         3.30 cm 1.58 cm/m  RA Area:     12.20 cm LA Vol (A2C):   49.2 ml 23.55 ml/m RA Volume:   24.10 ml  11.53 ml/m LA Vol (A4C):   38.8 ml 18.57 ml/m LA Biplane Vol: 43.6 ml 20.87 ml/m  AORTIC VALVE                    PULMONIC VALVE AV Area (Vmax):    2.08 cm     PV Vmax:        0.60 m/s AV Area (Vmean):   1.86 cm     PV Peak grad:   1.4 mmHg AV Area (VTI):     2.03 cm     RVOT Peak grad: 3 mmHg AV Vmax:           148.67 cm/s AV Vmean:          106.800 cm/s AV VTI:            0.328 m AV Peak Grad:      8.8 mmHg AV Mean Grad:      5.0 mmHg LVOT Vmax:         89.40 cm/s LVOT Vmean:        57.200 cm/s LVOT VTI:          0.192 m LVOT/AV VTI ratio: 0.59  AORTA Ao Root diam: 2.93 cm MITRAL VALVE                TRICUSPID VALVE MV  Area (PHT): 5.58 cm     TR Peak grad:   10.5 mmHg MV Decel Time: 136 msec     TR Vmax:        162.00 cm/s MV E velocity: 102.00 cm/s MV A velocity: 85.30 cm/s   SHUNTS MV E/A ratio:  1.20         Systemic VTI:  0.19 m                             Systemic Diam: 2.10 cm Lorine Bears MD Electronically signed by Lorine Bears MD Signature Date/Time: 12/26/2020/3:21:03 PM    Final       Subjective: No chest pain or shortness of breath.  No dizziness.  Discharge Exam: Vitals:   12/29/20 1200 12/29/20 1317  BP: (!) 171/61 (!) 157/57  Pulse: 74 73  Resp: 15 18  Temp:  98.3 F (36.8 C)  SpO2: 97% 96%   Vitals:   12/29/20 1115 12/29/20 1130 12/29/20 1200 12/29/20 1317  BP: (!) 167/59 (!) 159/66 (!) 171/61 (!) 157/57  Pulse: 77 78 74 73  Resp: Temp:    98.3 F (36.8 C)  TempSrc:      SpO2: 96% 95% 97% 96%  Weight:      Height:        General: Pt is alert, awake, not in acute distress Cardiovascular: RRR, S1/S2 +, no rubs, no gallops Respiratory: CTA bilaterally, no wheezing, no rhonchi Abdominal: Soft, NT, ND, bowel sounds + Extremities: no edema, no cyanosis    The results of significant diagnostics from this hospitalization (including imaging,  microbiology, ancillary and laboratory) are listed below for reference.     Microbiology: Recent Results (from the past 240 hour(s))  Resp Panel by RT-PCR (Flu A&B, Covid) Nasopharyngeal Swab     Status: None   Collection Time: 12/26/20  6:41 AM   Specimen: Nasopharyngeal Swab; Nasopharyngeal(NP) swabs in vial transport medium  Result Value Ref Range Status   SARS Coronavirus 2 by RT PCR NEGATIVE NEGATIVE Final    Comment: (NOTE) SARS-CoV-2 target nucleic acids are NOT DETECTED.  The SARS-CoV-2 RNA is generally detectable in upper respiratory specimens during the acute phase of infection. The lowest concentration of SARS-CoV-2 viral copies this assay can detect is 138 copies/mL. A negative result does not preclude SARS-Cov-2 infection and should not be used as the sole basis for treatment or other patient management decisions. A negative result may occur with  improper specimen collection/handling, submission of specimen other than nasopharyngeal swab, presence of viral mutation(s) within the areas targeted by this assay, and inadequate number of viral copies(<138 copies/mL). A negative result must be combined with clinical observations, patient history, and epidemiological information. The expected result is Negative.  Fact Sheet for Patients:  BloggerCourse.com  Fact Sheet for Healthcare Providers:  SeriousBroker.it  This test is no t yet approved or cleared by the [place] FDA and  has been authorized for detection and/or diagnosis of SARS-CoV-2 by FDA under an Emergency Use Authorization (EUA). This EUA will remain  in effect (meaning this test can be used) for the duration of the COVID-19 declaration under Section 564(b)(1) of the Act, 21 U.S.C.section 360bbb-3(b)(1), unless the authorization is terminated  or revoked sooner.       Influenza A by PCR NEGATIVE NEGATIVE Final   Influenza B by PCR NEGATIVE NEGATIVE  Final    Comment: (NOTE) The Xpert Xpress  SARS-CoV-2/FLU/RSV plus assay is intended as an aid in the diagnosis of influenza from Nasopharyngeal swab specimens and should not be used as a sole basis for treatment. Nasal washings and aspirates are unacceptable for Xpert Xpress SARS-CoV-2/FLU/RSV testing.  Fact Sheet for Patients: BloggerCourse.comhttps://www.fda.gov/media/152166/download  Fact Sheet for Healthcare Providers: SeriousBroker.ithttps://www.fda.gov/media/152162/download  This test is not yet approved or cleared by the [place] FDA and has been authorized for detection and/or diagnosis of SARS-CoV-2 by FDA under an Emergency Use Authorization (EUA). This EUA will remain in effect (meaning this test can be used) for the duration of the COVID-19 declaration under Section 564(b)(1) of the Act, 21 U.S.C. section 360bbb-3(b)(1), unless the authorization is terminated or revoked.  Performed at [facility], [address] [place], [place] [id number]      Labs: BNP (last 3 results) No results for input(s): BNP in the last 8760 hours. Basic Metabolic Panel: Recent Labs  Lab 12/26/20 0634 12/26/20 0826 12/27/20 0630 12/29/20 0427  NA 140  --   --  141  K 4.1  --   --  4.1  CL 107  --   --  108  CO2 21*  --   --  26  GLUCOSE 195*  --   --  177*  BUN 51*  --   --  37*  CREATININE 1.65*  --  1.41* 1.40*  CALCIUM 9.3  --   --  9.2  MG  --  2.0  --   --    Liver Function Tests: Recent Labs  Lab 12/26/20 0634  AST 26  ALT 15  ALKPHOS 74  BILITOT 0.7  PROT 7.6  ALBUMIN 3.8   No results for input(s): LIPASE, AMYLASE in the last 168 hours. No results for input(s): AMMONIA in the last 168 hours. CBC: Recent Labs  Lab 12/26/20 0634 12/27/20 0630 12/28/20 0157 12/29/20 0427  WBC 8.8 9.4 9.0 7.7  HGB 11.1* 11.0* 10.7* 10.5*  HCT 32.8* 33.0* 32.7* 32.6*  MCV 92.9 92.4 92.6 93.7  PLT 229 223 214 197   Cardiac Enzymes: No results for input(s): CKTOTAL, CKMB, CKMBINDEX,  TROPONINI in the last 168 hours. BNP: Invalid input(s): POCBNP CBG: Recent Labs  Lab 12/28/20 2035 12/29/20 0733 12/29/20 0913 12/29/20 1123 12/29/20 1315  GLUCAP 267* 163* 169* 147* 195*   D-Dimer No results for input(s): DDIMER in the last 72 hours. Hgb A1c No results for input(s): HGBA1C in the last 72 hours. Lipid Profile Recent Labs    12/27/20 0630  CHOL 216*  HDL 34*  LDLCALC 113*  TRIG 343*  CHOLHDL 6.4   Thyroid function studies No results for input(s): TSH, T4TOTAL, T3FREE, THYROIDAB in the last 72 hours.  Invalid input(s): FREET3 Anemia work up No results for input(s): VITAMINB12, FOLATE, FERRITIN, TIBC, IRON, RETICCTPCT in the last 72 hours. Urinalysis No results found for: COLORURINE, APPEARANCEUR, LABSPEC, PHURINE, GLUCOSEU, HGBUR, BILIRUBINUR, KETONESUR, PROTEINUR, UROBILINOGEN, NITRITE, LEUKOCYTESUR Sepsis Labs Invalid input(s): PROCALCITONIN,  WBC,  LACTICIDVEN Microbiology Recent Results (from the past 240 hour(s))  Resp Panel by RT-PCR (Flu A&B, Covid) Nasopharyngeal Swab     Status: None   Collection Time: 12/26/20  6:41 AM   Specimen: Nasopharyngeal Swab; Nasopharyngeal(NP) swabs in vial transport medium  Result Value Ref Range Status   SARS Coronavirus 2 by RT PCR NEGATIVE NEGATIVE Final    Comment: (NOTE) SARS-CoV-2 target nucleic acids are NOT DETECTED.  The SARS-CoV-2 RNA is generally detectable in upper respiratory specimens during the acute phase of  infection. The lowest concentration of SARS-CoV-2 viral copies this assay can detect is 138 copies/mL. A negative result does not preclude SARS-Cov-2 infection and should not be used as the sole basis for treatment or other patient management decisions. A negative result may occur with  improper specimen collection/handling, submission of specimen other than nasopharyngeal swab, presence of viral mutation(s) within the areas targeted by this assay, and inadequate number of  viral copies(<138 copies/mL). A negative result must be combined with clinical observations, patient history, and epidemiological information. The expected result is Negative.  Fact Sheet for Patients:  BloggerCourse.com  Fact Sheet for Healthcare Providers:  SeriousBroker.it  This test is no t yet approved or cleared by the [place] FDA and  has been authorized for detection and/or diagnosis of SARS-CoV-2 by FDA under an Emergency Use Authorization (EUA). This EUA will remain  in effect (meaning this test can be used) for the duration of the COVID-19 declaration under Section 564(b)(1) of the Act, 21 U.S.C.section 360bbb-3(b)(1), unless the authorization is terminated  or revoked sooner.       Influenza A by PCR NEGATIVE NEGATIVE Final   Influenza B by PCR NEGATIVE NEGATIVE Final    Comment: (NOTE) The Xpert Xpress SARS-CoV-2/FLU/RSV plus assay is intended as an aid in the diagnosis of influenza from Nasopharyngeal swab specimens and should not be used as a sole basis for treatment. Nasal washings and aspirates are unacceptable for Xpert Xpress SARS-CoV-2/FLU/RSV testing.  Fact Sheet for Patients: BloggerCourse.com  Fact Sheet for Healthcare Providers: SeriousBroker.it  This test is not yet approved or cleared by the [place] FDA and has been authorized for detection and/or diagnosis of SARS-CoV-2 by FDA under an Emergency Use Authorization (EUA). This EUA will remain in effect (meaning this test can be used) for the duration of the COVID-19 declaration under Section 564(b)(1) of the Act, 21 U.S.C. section 360bbb-3(b)(1), unless the authorization is terminated or revoked.  Performed at [facility], [address] [place], [place] [address]      Time coordinating discharge: Over 30 minutes  SIGNED:   Lynn Ito, MD  [facility] 12/29/2020, 2:35 PM Pager   If 7PM-7AM, please contact night-coverage [email or web address] Password [email or web address]

## 2020-12-29 NOTE — Consult Note (Signed)
ANTICOAGULATION CONSULT NOTE  Pharmacy Consult for heparin Indication: chest pain/ACS and new onset AFib  Allergies  Allergen Reactions   Erythromycin Other (See Comments)   Allopurinol Diarrhea   Ezetimibe Diarrhea   Morphine And Related Rash    Patient Measurements: Height: 5\' 3"  (160 cm) Weight: 108.9 kg (240 lb) IBW/kg (Calculated) : 52.4 Heparin Dosing Weight: 78.5kg  Vital Signs: Temp: 98.2 F (36.8 C) (08/08 0420) Temp Source: Oral (08/08 0420) BP: 158/61 (08/08 0420) Pulse Rate: 73 (08/08 0420)  Labs: Recent Labs    12/26/20 0634 12/26/20 0826 12/26/20 0922 12/26/20 1030 12/26/20 1434 12/26/20 2038 12/27/20 0630 12/27/20 0911 12/27/20 1737 12/28/20 0157 12/29/20 0427  HGB 11.1*  --   --   --   --   --  11.0*  --   --  10.7* 10.5*  HCT 32.8*  --   --   --   --   --  33.0*  --   --  32.7* 32.6*  PLT 229  --   --   --   --   --  223  --   --  214 197  APTT  --   --  29  --   --   --   --   --   --   --   --   LABPROT  --   --  13.0  --   --   --   --   --   --   --   --   INR  --   --  1.0  --   --   --   --   --   --   --   --   HEPARINUNFRC  --   --   --   --   --    < > 0.10*  --  0.45 0.40 0.31  CREATININE 1.65*  --   --   --   --   --  1.41*  --   --   --  1.40*  TROPONINIHS 24*   < >  --  1,311* 2,199*  --   --  1,330*  --   --   --    < > = values in this interval not displayed.     Estimated Creatinine Clearance: 39.8 mL/min (A) (by C-G formula based on SCr of 1.4 mg/dL (H)).   Medical History: Past Medical History:  Diagnosis Date   Anemia    CAD in native artery    a. MI ~ 2000 s/p PCI x 2; b. PCI ~ 2015; c. PCI ~ 2020 complicated vy contrast-induced nephropathy; d. PCI 07/2020   CKD (chronic kidney disease)    DM2 (diabetes mellitus, type 2) (HCC)    Essential hypertension    Falls    Hyperlipidemia LDL goal <70    Obesity    Peripheral neuropathy       Assessment: Ms. Bachand is a 75yo female presenting with chest pain, new onset  atrial fibrilation (CHA2DS2-VASc=6), and rapid ventricular rate. Cardiology is following. Pharmacy was consulted for heparin initiation for ACS and new onset AFib.  The PTA medication list does not include a DOAC.  0806 1737 HL 0.45 therapeutic x 1  Goal of Therapy:  Heparin level 0.3-0.7 units/ml aPTT 66-102 seconds Monitor platelets by anticoagulation protocol: Yes   Plan:  8/8:  HL @ 0427 = 0.31, therapeutic X 3 Will continue pt on current rate and recheck HL on  8/9 with AM labs.   Scherrie Gerlach, PharmD Clinical Pharmacist 12/29/2020 5:53 AM

## 2020-12-29 NOTE — Telephone Encounter (Signed)
   Per Dr. Jari Sportsman secure chat on 12/29/20:  Misty Stanley or [place], can you please arrange for basic metabolic profile on Wednesday and follow-up in our office next week?

## 2020-12-29 NOTE — Progress Notes (Signed)
Progress Note  Patient Name: Hannah Thomas Date of Encounter: 12/29/2020  [facility] Cardiologist: None   Subjective   Plan for [facility] today. Kidney function stable this AM. Patient remains in SR. No chest pain or SOB, she has some anxiety for the procedure.   Inpatient Medications    Scheduled Meds:  amLODipine  10 mg Oral Daily   aspirin EC  81 mg Oral Daily   clopidogrel  75 mg Oral Daily   ezetimibe  10 mg Oral Daily   insulin aspart  0-20 Units Subcutaneous TID WC   losartan  50 mg Oral Daily   metoprolol succinate  50 mg Oral QHS   nitroGLYCERIN  0.2 mg Transdermal Daily   sodium chloride flush  3 mL Intravenous Q12H   sodium chloride flush  3 mL Intravenous Q12H   Continuous Infusions:  sodium chloride     sodium chloride     sodium chloride 125 mL/hr at 12/29/20 0418   heparin 1,700 Units/hr (12/29/20 0400)   PRN Meds: sodium chloride, sodium chloride, acetaminophen, nitroGLYCERIN, ondansetron (ZOFRAN) IV, sodium chloride flush, sodium chloride flush   Vital Signs    Vitals:   12/28/20 1653 12/28/20 2011 12/29/20 0420 12/29/20 0733  BP: (!) 164/62 (!) 159/63 (!) 158/61 (!) 178/73  Pulse: 72 84 73 78  Resp: 18 20 18 19   Temp: 97.8 F (36.6 C) 98.2 F (36.8 C) 98.2 F (36.8 C) 98.3 F (36.8 C)  TempSrc: Oral  Oral   SpO2: 99% 98% 98% 99%  Weight:      Height:        Intake/Output Summary (Last 24 hours) at 12/29/2020 0819 Last data filed at 12/28/2020 2100 Gross per 24 hour  Intake --  Output 500 ml  Net -500 ml   Last 3 Weights 12/26/2020  Weight (lbs) 240 lb  Weight (kg) 108.863 kg      Telemetry    NSR, HR 70s, PVCs - Personally Reviewed  ECG    No new - Personally Reviewed  Physical Exam   GEN: No acute distress.   Neck: No JVD Cardiac: RRR, no murmurs, rubs, or gallops.  Respiratory: Clear to auscultation bilaterally. GI: Soft, nontender, non-distended  MS: No edema; No deformity. Neuro:  Nonfocal  Psych: Normal affect   Labs     High Sensitivity Troponin:   Recent Labs  Lab 12/26/20 0634 12/26/20 0826 12/26/20 1030 12/26/20 1434 12/27/20 0911  TROPONINIHS 24* 420* 1,311* 2,199* 1,330*      Chemistry Recent Labs  Lab 12/26/20 0634 12/27/20 0630 12/29/20 0427  NA 140  --  141  K 4.1  --  4.1  CL 107  --  108  CO2 21*  --  26  GLUCOSE 195*  --  177*  BUN 51*  --  37*  CREATININE 1.65* 1.41* 1.40*  CALCIUM 9.3  --  9.2  PROT 7.6  --   --   ALBUMIN 3.8  --   --   AST 26  --   --   ALT 15  --   --   ALKPHOS 74  --   --   BILITOT 0.7  --   --   GFRNONAA 32* 38* 39*  ANIONGAP 12  --  7     Hematology Recent Labs  Lab 12/27/20 0630 12/28/20 0157 12/29/20 0427  WBC 9.4 9.0 7.7  RBC 3.57* 3.53* 3.48*  HGB 11.0* 10.7* 10.5*  HCT 33.0* 32.7* 32.6*  MCV 92.4  92.6 93.7  MCH 30.8 30.3 30.2  MCHC 33.3 32.7 32.2  RDW 13.6 13.6 13.6  PLT 223 214 197    BNPNo results for input(s): BNP, PROBNP in the last 168 hours.   DDimer No results for input(s): DDIMER in the last 168 hours.   Radiology    No results found.  Cardiac Studies   2D echo 12/26/2020: 1. Left ventricular ejection fraction, by estimation, is 55 to 60%. The  left ventricle has normal function. The left ventricle demonstrates  regional wall motion abnormalities (see scoring diagram/findings for  description). There is mild left ventricular   hypertrophy. Left ventricular diastolic parameters were normal. There is  mild hypokinesis of the left ventricular, basal-mid inferior wall.   2. Right ventricular systolic function is normal. The right ventricular  size is normal. Tricuspid regurgitation signal is inadequate for assessing  PA pressure.   3. The mitral valve is normal in structure. Mild mitral valve  regurgitation. No evidence of mitral stenosis.   4. The aortic valve is normal in structure. Aortic valve regurgitation is  not visualized. Mild to moderate aortic valve sclerosis/calcification is  present, without any  evidence of aortic stenosis.  Patient Profile     75 y.o. female with h/o CAD s/p multiple PCI, DM2 with peripheral neuropathy, CKD stage 3, HTN, HLD intolerant to statins, morbid obesity, PAS, falls, and suspected sleep apnea who is being seen for chest pressure, elevated troponin and new onset Afib.   Assessment & Plan    New onset Afib - found to have Afib RVR with rates in the 150s - She converted to SR with IV cardizem in the ED - Continue metoprolol - CHADSVASC at least 6 (HTN, agex2, DM, PAD, female) - IV heparin, plan to transition to DOAC prior to discharge - Echo showed LVEF 50-60%, mild LVH, normal diasotlic parameters, mild MR - TSH normal - Keep K>4 and Mag >2  CAD s/p multiple PCIs, most recent 07/2020 to the mid LAD in [place] - reported chest pain similar to prior PCI on presentation, she is chest pain free now - HS trop 24>420>2199>1,330 - IV heparin - continue plavix, ASA, zetia, BB - LHC today, premedicated for h/o contrast induced nephropathy - further recs pending cath  HTN - elevated pressures - amlodipine 10mg , losartan 50mg  daily, Toprol-XL 50mg  daily - can transition metoprolol to coreg. May need hydralazine  HLD - LDL 113, goal<70 - statin intolerance, continue Zetia  CKD stage 3 - Scr around baseline - monitor daily BMET  Anemia - stable bye labs - monitor post-cath  DM2 - A1C 6.7 - per IM  Obesity Suspected OSA - weight loss encouraged - needs outpatient work-up for possible OSA  For questions or updates, please contact [facility] Please consult [email or web address] for contact info under        Signed, Paysen Goza , PA-C  12/29/2020, 8:19 AM

## 2020-12-29 NOTE — Plan of Care (Signed)

## 2020-12-29 NOTE — Progress Notes (Signed)
Pt is A&O, VS stable, NSR On the monitor. IVx2 in place. One complaint of headache, relieved with tylenol. NPO since MN for LHC today. Prepped and NS started @ 125/hr. Heparin at 17/hr.

## 2020-12-30 ENCOUNTER — Encounter: Payer: Self-pay | Admitting: Cardiovascular Disease

## 2020-12-30 NOTE — Telephone Encounter (Addendum)
Patient made aware of Dr. Jari Sportsman recommendation for her to have a bmp drawn Wed 12/31/20 at [facility] mall. Patient provided the lab hours. Patient verbalized understanding and voiced appreciation for the call.

## 2020-12-31 ENCOUNTER — Other Ambulatory Visit
Admission: RE | Admit: 2020-12-31 | Discharge: 2020-12-31 | Disposition: A | Discharge status: Home / Self Care | Payer: Medicare Other | Source: Ambulatory Visit | Attending: Cardiovascular Disease | Admitting: Cardiovascular Disease

## 2020-12-31 ENCOUNTER — Telehealth: Payer: Self-pay

## 2020-12-31 DIAGNOSIS — I4891 Unspecified atrial fibrillation: Secondary | ICD-10-CM | POA: Insufficient documentation

## 2020-12-31 DIAGNOSIS — E1122 Type 2 diabetes mellitus with diabetic chronic kidney disease: Secondary | ICD-10-CM

## 2020-12-31 LAB — BASIC METABOLIC PANEL
Anion gap: 7 (ref 5–15)
BUN: 39 mg/dL — ABNORMAL HIGH (ref 8–23)
CO2: 23 mmol/L (ref 22–32)
Calcium: 9.5 mg/dL (ref 8.9–10.3)
Chloride: 107 mmol/L (ref 98–111)
Creatinine, Ser: 1.91 mg/dL — ABNORMAL HIGH (ref 0.44–1.00)
GFR, Estimated: 27 mL/min — ABNORMAL LOW (ref >60)
Glucose, Bld: 167 mg/dL — ABNORMAL HIGH (ref 70–99)
Potassium: 4.4 mmol/L (ref 3.5–5.1)
Sodium: 137 mmol/L (ref 135–145)

## 2020-12-31 MED ORDER — LOSARTAN POTASSIUM 50 MG PO TABS: 50.0000 mg | ORAL_TABLET | Freq: Every day | ORAL | 0 refills | Status: DC

## 2020-12-31 NOTE — Telephone Encounter (Signed)
-----   Message from Iran Ouch, MD sent at 12/31/2020  4:43 PM EDT ----- Her renal function is mildly worse.  Decrease losartan to 50 mg once daily.  Increase fluid intake.  Do not resume metformin yet.  Repeat basic metabolic profile on Monday. If additional blood pressure control is needed, we will have to switch metoprolol to carvedilol.

## 2020-12-31 NOTE — Telephone Encounter (Signed)
Patient made aware of lab results and Dr. Jari Sportsman recommendation.  She will reduce losartan to 50 mg daily Continue to hold Metformin Have a repeat bmp on 01/05/21 at the medical mall.  Patient has additional medications concerns. Pt sts that she has taken zetia in the past and it caused severe diarrhea. Zetia was prescribed after the patients recent d/c from the hospital. She also mentioned trying and failing a couple of statin medications. She says she will not take zetia.  She also voiced concern about taking Eliquis and wanted to wait to be seen in follow up before starting the medication. Patient sts that she has had 2 falls in the past year and is concerned about injury.  Adv the patient about the reasoning for prescribing both medications. Discussed briefly afib and the benefit of anticoag. Adv the patient that she can discuss risk vs benefits when she comes in her f/u appt. I adv her to consider starting Eliquis now for stroke prevention. Patient would like a message with her concerns fwd to Dr. Kirke Corin. Adv her I will fwd the message and call back with his response.

## 2021-01-01 NOTE — Telephone Encounter (Signed)
Okay not to take Zetia for now.  However, I think she should take Eliquis to decrease her risk of stroke.  We can discuss further during office visit.

## 2021-01-02 NOTE — Telephone Encounter (Signed)
Patient made aware of Dr. Jari Sportsman recommendation. Patient sts that she has started Eliquis and is taking it as prescribed. Adv her that management of her lipids can be discussed at her upcoming appt.  Patient verbalized understanding and voiced appreciation for the call.

## 2021-01-05 ENCOUNTER — Other Ambulatory Visit
Admission: RE | Admit: 2021-01-05 | Discharge: 2021-01-05 | Disposition: A | Discharge status: Home / Self Care | Payer: Medicare Other | Source: Ambulatory Visit | Attending: Cardiovascular Disease | Admitting: Cardiovascular Disease

## 2021-01-05 DIAGNOSIS — N183 Chronic kidney disease, stage 3 unspecified: Secondary | ICD-10-CM | POA: Insufficient documentation

## 2021-01-05 DIAGNOSIS — E1122 Type 2 diabetes mellitus with diabetic chronic kidney disease: Secondary | ICD-10-CM | POA: Diagnosis present

## 2021-01-05 LAB — BASIC METABOLIC PANEL
Anion gap: 4 — ABNORMAL LOW (ref 5–15)
BUN: 31 mg/dL — ABNORMAL HIGH (ref 8–23)
CO2: 24 mmol/L (ref 22–32)
Calcium: 8.8 mg/dL — ABNORMAL LOW (ref 8.9–10.3)
Chloride: 108 mmol/L (ref 98–111)
Creatinine, Ser: 1.73 mg/dL — ABNORMAL HIGH (ref 0.44–1.00)
GFR, Estimated: 30 mL/min — ABNORMAL LOW (ref >60)
Glucose, Bld: 141 mg/dL — ABNORMAL HIGH (ref 70–99)
Potassium: 4.9 mmol/L (ref 3.5–5.1)
Sodium: 136 mmol/L (ref 135–145)

## 2021-01-06 NOTE — Progress Notes (Signed)
Cardiology Office Note:    Date:  01/07/2021   ID:  Hannah Thomas, DOB 01/19/1946, MRN [id number]  PCP:  Pcp, No  [facility] Cardiologist:  Dr. [facility] Electrophysiologist:  None   Referring MD: No ref. provider found   Chief Complaint: hospital follow-up  History of Present Illness:    Hannah Thomas is a 75 y.o. female with a hx of with h/o CAD s/p multiple PCI, DM2 with peripheral neuropathy, CKD stage 3, HTN, HLD intolerant to statins, morbid obesity, PAD, falls, and suspected sleep apnea who was recently  hospitalized for chest pressure, elevated troponin and new onset Afib. Heart rates on presentation 150s and she converted to SR on IV cardizem in the ER.  Echo showed LVEF 50-60%, mild LVH, normal diasotlic parameters, mild MR. TSH normal. CHADSVAC at least  (HTN, agex2, DM, PAD, female). She was started on BB. Due to elevated trop and Chest pain patient underwent LHC. This showed patent stents with chronically occluded left circuflex. She was started on on a/c and aspirin was stopped, plavix was continued.Due to CKD lasix and metformin were discontinued.   Today, the patient reports she was taken off fluid pills and she is retaining fluid. Also has not started metformin. Says she is up 9 lbs since discharge. Has lower leg edema ion exam. Breathing is good. She was on lasix 20mg  daily. Recommend compression socks. She elevates her legs. She eats low salt diet. BP very high today. Feels very stressed, they are selling house and moving to . BP at home is 140s/ 70s, Hr 80s. She is taking Eliquis, denies bleeding issues. She is not taking Zetia. PCP started her on Livalo. Willing to try Repatha. EKG shows SR, 82bpm.  She does have OSA, but has not started CPAP, plans to follow-up for this. Cath site, right radial, without complications.   Past Medical History:  Diagnosis Date   Anemia    CAD in native artery    a. MI ~ 2000 s/p PCI x 2; b. PCI ~ 2015; c. PCI ~ 2020 complicated  vy contrast-induced nephropathy; d. PCI 07/2020   CKD (chronic kidney disease)    DM2 (diabetes mellitus, type 2) (HCC)    Essential hypertension    Falls    Hyperlipidemia LDL goal <70    Obesity    Peripheral neuropathy     Past Surgical History:  Procedure Laterality Date   CARDIAC CATHETERIZATION     LEFT HEART CATH AND CORONARY ANGIOGRAPHY N/A 12/29/2020   Procedure: LEFT HEART CATH AND CORONARY ANGIOGRAPHY;  Surgeon: 02/28/2021, MD;  Location: [facility] INVASIVE CV LAB;  Service: Cardiovascular;  Laterality: N/A;    Current Medications: Current Meds  Medication Sig   carvedilol (COREG) 12.5 MG tablet Take 1 tablet (12.5 mg total) by mouth 2 (two) times daily.   furosemide (LASIX) 20 MG tablet Take 1 tablet (20 mg total) by mouth daily.     Allergies:   Erythromycin, Allopurinol, Ezetimibe, and Morphine and related   Social History   Socioeconomic History   Marital status: Married    Spouse name: Not on file   Number of children: Not on file   Years of education: Not on file   Highest education level: Not on file  Occupational History   Not on file  Tobacco Use   Smoking status: Former    Packs/day: 0.00    Types: Cigarettes    Start date: 72    Quit date: 1977  Years since quitting: 45.6   Smokeless tobacco: Never  Substance and Sexual Activity   Alcohol use: Not Currently   Drug use: Never   Sexual activity: Not on file  Other Topics Concern   Not on file  Social History Narrative   Not on file   Social Determinants of Health   Financial Resource Strain: Not on file  Food Insecurity: Not on file  Transportation Needs: Not on file  Physical Activity: Not on file  Stress: Not on file  Social Connections: Not on file     Family History: The patient's family history includes Breast cancer in her mother; CAD in her mother; Dementia in her father; Endometrial cancer in her sister; Hypertension in her mother; Migraines in her mother; Parkinson's  disease in her father; Stroke in her mother.  ROS:   Please see the history of present illness.     All other systems reviewed and are negative.  EKGs/Labs/Other Studies Reviewed:    The following studies were reviewed today:  Echo 12/26/20 1. Left ventricular ejection fraction, by estimation, is 55 to 60%. The  left ventricle has normal function. The left ventricle demonstrates  regional wall motion abnormalities (see scoring diagram/findings for  description). There is mild left ventricular   hypertrophy. Left ventricular diastolic parameters were normal. There is  mild hypokinesis of the left ventricular, basal-mid inferior wall.   2. Right ventricular systolic function is normal. The right ventricular  size is normal. Tricuspid regurgitation signal is inadequate for assessing  PA pressure.   3. The mitral valve is normal in structure. Mild mitral valve  regurgitation. No evidence of mitral stenosis.   4. The aortic valve is normal in structure. Aortic valve regurgitation is  not visualized. Mild to moderate aortic valve sclerosis/calcification is  present, without any evidence of aortic stenosis.    LHC 12/29/20 Conclusion      Prox RCA to Mid RCA lesion is 10% stenosed.   Dist RCA lesion is 20% stenosed.   RPAV lesion is 10% stenosed.   Ost LAD to Prox LAD lesion is 20% stenosed.   Dist LM lesion is 30% stenosed.   Prox Cx to Mid Cx lesion is 100% stenosed.   Ost Cx to Prox Cx lesion is 95% stenosed with 95% stenosed side branch in 1st Mrg.   Previously placed Mid LAD stent (unknown type) is  widely patent.   1.  Significant underlying three-vessel coronary artery disease with patent stents in the LAD and right coronary artery with minimal restenosis.  Chronically occluded left circumflex with right to left and left to left collaterals 2.  Left ventricular angiography was not performed.  EF was normal by echo. 3.  Mildly elevated left ventricular end-diastolic pressure.    Recommendations: Suspect that the patient had elevated troponin due to supply demand ischemia related to chronically occluded left circumflex in the setting of A. fib with RVR.  No revascularization is indicated. Continue medical therapy. Start anticoagulation with Eliquis 5 mg twice daily tomorrow to be used with clopidogrel 75 mg once daily.  Discontinue aspirin. I increase losartan to 100 mg daily for blood pressure control. The patient can likely be discharged home later today.    EKG:  EKG is  ordered today.  The ekg ordered today demonstrates NSR, 82bpm, LAD, RBBB  Recent Labs: 12/26/2020: ALT 15; Magnesium 2.0; TSH 1.571 12/29/2020: Hemoglobin 10.5; Platelets 197 01/05/2021: BUN 31; Creatinine, Ser 1.73; Potassium 4.9; Sodium 136  Recent Lipid Panel  Component Value Date/Time   CHOL 216 (H) 12/27/2020 0630   TRIG 343 (H) 12/27/2020 0630   HDL 34 (L) 12/27/2020 0630   CHOLHDL 6.4 12/27/2020 0630   VLDL 69 (H) 12/27/2020 0630   LDLCALC 113 (H) 12/27/2020 0630    Physical Exam:    VS:  BP (!) 194/64 (BP Location: Left Arm, Patient Position: Sitting, Cuff Size: Large)   Pulse 83   Ht 5\' 3"  (1.6 m)   Wt 252 lb 4 oz (114.4 kg)   SpO2 98%   BMI 44.68 kg/m     Wt Readings from Last 3 Encounters:  01/07/21 252 lb 4 oz (114.4 kg)  12/29/20 240 lb (108.9 kg)     GEN:  Well nourished, well developed in no acute distress HEENT: Normal NECK: No JVD; No carotid bruits LYMPHATICS: No lymphadenopathy CARDIAC: RRR, no murmurs, rubs, gallops RESPIRATORY:  Clear to auscultation without rales, wheezing or rhonchi  ABDOMEN: Soft, non-tender, non-distended MUSCULOSKELETAL:  No edema; No deformity  SKIN: Warm and dry NEUROLOGIC:  Alert and oriented x 3 PSYCHIATRIC:  Normal affect   ASSESSMENT:    1. Atrial fibrillation with rapid ventricular response (HCC)   2. CAD in native artery   3. NSTEMI (non-ST elevated myocardial infarction) (HCC)   4. CKD stage 3 due to type 2 diabetes  mellitus (HCC)   5. Hyperlipidemia, mixed   6. Essential hypertension   7. Coronary artery disease involving native coronary artery of native heart without angina pectoris   8. Lower leg edema   9. Anemia, unspecified type   10. Stage 3 chronic kidney disease, unspecified whether stage 3a or 3b CKD (HCC)    PLAN:    In order of problems listed above:  New onset afib Patient is in SR by EKG with heart rate 82bpm. She denies bleeding issues with Eliquis. CHADSVASC at least 6 ( HTN, DM, PAD, female, agex2). Check CBC in a week. Continue rate control with BB. Recent echo showed LVEF 55-60%. Patient is moving [place] in about 2 months, she has cardiologist established there.   CAD s/p multiple PCI, most recent 07/2020 to the mid LAD in 08/2020 Patient had troponin elevation to 2199 in the setting of Afib RVR and known CAD. LHC showed patent stents, significant underlying CAD, chronically occluded left circumflex with right to left collaterals. No PCI performed. Suspect elevated troponin from demand ischemia. Cath site, right radius is stable. Patient denies anginal symptoms. Continue plavix 75mg  daily and BB. Will start Repatha with h/o statin intolerance.   HTN Initial Bps severely elevated 190s/ 60s. Re-check showed 162/62. Suspect some elevation from extra volume. Start lasix 20mg  daily. I will also change Toprol to Coreg 12.5mg  BID. She has a h/o fatigue on BB, so monitor symptoms closely. May need to increase Losartan if follow-up labs are normal.   HLD LDL 113 12/2020. She is intolerant to statins. No Zetia 2/2 diarrhea. Start Repatha, can re-check lipids at follow-up.  CKD stage 3 Losartan reduced to 50mg  daily. Metformin and lasix stopped at discharge. Follow-up BMET showed improved kidney function. Start lasix for LLE. BMET in a week.  Anemia Hgb baseline 10-11. CBC in a week with initiation of Eliquis  Obesity Suspected OSA Ptient says she was diagnosed with sleep apnea, but has  not been fitted for a mas. She plans on following up for this in the near future.   LLE Venous insufficiency Patient previously on lasix 20mg  daily, but this was stopped at  discharge for CKD. Shre reports lower leg edema with 10lbs weights gain. No SOB, orthopnea, pnd. Recent echo showed normal diastolic dysfunction. She has 1+ swelling on exam. Restart lasix 20mg  daily. BMET in a week. Also she is on amlodipine 10mg  daily. Reassess at follow-up.   Disposition: Follow up in 2 month(s) with MD/APP   Signed, Namiko Pritts David StallH Romir Klimowicz, PA-C  01/07/2021 12:42 PM    [facility]

## 2021-01-07 ENCOUNTER — Ambulatory Visit (INDEPENDENT_AMBULATORY_CARE_PROVIDER_SITE_OTHER): Payer: Medicare Other | Admitting: Medical

## 2021-01-07 ENCOUNTER — Encounter: Payer: Self-pay | Admitting: Medical

## 2021-01-07 ENCOUNTER — Other Ambulatory Visit: Payer: Self-pay

## 2021-01-07 VITALS — BP 194/64 | HR 83 | Ht 63.0 in | Wt 252.2 lb

## 2021-01-07 DIAGNOSIS — I4891 Unspecified atrial fibrillation: Secondary | ICD-10-CM | POA: Diagnosis not present

## 2021-01-07 DIAGNOSIS — I2510 Atherosclerotic heart disease of native coronary artery without angina pectoris: Secondary | ICD-10-CM | POA: Diagnosis not present

## 2021-01-07 DIAGNOSIS — E1122 Type 2 diabetes mellitus with diabetic chronic kidney disease: Secondary | ICD-10-CM

## 2021-01-07 DIAGNOSIS — I252 Old myocardial infarction: Secondary | ICD-10-CM | POA: Diagnosis not present

## 2021-01-07 DIAGNOSIS — I10 Essential (primary) hypertension: Secondary | ICD-10-CM

## 2021-01-07 DIAGNOSIS — D649 Anemia, unspecified: Secondary | ICD-10-CM

## 2021-01-07 DIAGNOSIS — N183 Chronic kidney disease, stage 3 unspecified: Secondary | ICD-10-CM

## 2021-01-07 DIAGNOSIS — R600 Localized edema: Secondary | ICD-10-CM

## 2021-01-07 DIAGNOSIS — E782 Mixed hyperlipidemia: Secondary | ICD-10-CM

## 2021-01-07 DIAGNOSIS — I214 Non-ST elevation (NSTEMI) myocardial infarction: Secondary | ICD-10-CM | POA: Diagnosis not present

## 2021-01-07 MED ORDER — FUROSEMIDE 20 MG PO TABS: 20.0000 mg | ORAL_TABLET | Freq: Every day | ORAL | 3 refills | Status: DC

## 2021-01-07 MED ORDER — CARVEDILOL 12.5 MG PO TABS: 12.5000 mg | ORAL_TABLET | Freq: Two times a day (BID) | ORAL | 3 refills | Status: DC

## 2021-01-07 NOTE — Patient Instructions (Addendum)
Medication Instructions:  Your physician has recommended you make the following change in your medication:   STOP Metoprolol succinate START Furosemide (Lasix) 20 mg once daily START Carvediolol 12.5 mg take twice a day STOP Ezetimibe START Repatha. We will send referral to our pharmacist for their assistance with getting this started.  *If you need a refill on your cardiac medications before your next appointment, please call your pharmacy*   Lab Work: CBC and Bmet in one week. No appointment is needed for this.  Medical Mall Entrance at [facility] 1st desk on the right to check in, past the screening table Lab hours: Monday- Friday (7:30 am- 5:30 pm)  If you have labs (blood work) drawn today and your tests are completely normal, you will receive your results only by: MyChart Message (if you have MyChart) OR A paper copy in the mail If you have any lab test that is abnormal or we need to change your treatment, we will call you to review the results.   Testing/Procedures: None   Follow-Up: At [facility], you and your health needs are our priority.  As part of our continuing mission to provide you with exceptional heart care, we have created designated Provider Care Teams.  These Care Teams include your primary Cardiologist (physician) and Advanced Practice Providers (APPs -  Physician Assistants and Nurse Practitioners) who all work together to provide you with the care you need, when you need it.  We recommend signing up for the patient portal called "MyChart".  Sign up information is provided on this After Visit Summary.  MyChart is used to connect with patients for Virtual Visits (Telemedicine).  Patients are able to view lab/test results, encounter notes, upcoming appointments, etc.  Non-urgent messages can be sent to your provider as well.   To learn more about what you can do with MyChart, go to [email or web address].    Your next appointment:   1-2 month(s) before you  leave the state.  The format for your next appointment:   In Person  Provider:   Lorine Bears, MD or Cadence Lorna Few   Other Instructions Referral has been sent to Cardiac Rehab. Once they process through insurance they will reach out to you to schedule this. [phone]   Referral has also been placed for our pharmacy team to assist with starting Repatha. Someone will be in touch to assist with this.

## 2021-01-08 ENCOUNTER — Ambulatory Visit: Payer: Medicare Other

## 2021-01-08 ENCOUNTER — Telehealth: Payer: Self-pay | Admitting: Medical

## 2021-01-08 MED ORDER — FUROSEMIDE 20 MG PO TABS: 40.0000 mg | ORAL_TABLET | Freq: Every day | ORAL | 0 refills | Status: DC

## 2021-01-08 NOTE — Telephone Encounter (Signed)
Per secure chat with Cadence Lorna Few 01/08/21 at 12:34 pm  ok stay on metoprolol.   Medication list updated.

## 2021-01-08 NOTE — Telephone Encounter (Signed)
Pt c/o medication issue:  1. Name of Medication: furosemide 40 mg po q d and carvedilol 12.5 mg po BID   2. How are you currently taking this medication (dosage and times per day)? See above   3. Are you having a reaction (difficulty breathing--STAT)? No   4. What is your medication issue? Seen yesterday in office previous dose of furosemide confirmed now as 40 mg daily new rx currently 20 causing a decrease   Patient concerned about carvedilol as a new med since she does frequent eye injections for macular issues.

## 2021-01-08 NOTE — Telephone Encounter (Signed)
Left voicemail message that she can continue her metoprolol but to call back so I can review.

## 2021-01-08 NOTE — Telephone Encounter (Signed)
Will cancel Repatha appointment this afternoon

## 2021-01-08 NOTE — Telephone Encounter (Signed)
Spoke with patient and reviewed provider has been updated about her desires to remain on Metoprolol instead of starting the carvedilol. Medication list updated and she was appreciative for the call and review.

## 2021-01-08 NOTE — Telephone Encounter (Signed)
Spoke with patient and she is very concerned about the side effects of the carvedilol. She stated that it took a long time for them to get her metoprolol at a dose she can tolerate. She is requesting to remain on the metoprolol succinate 50 mg dose daily at bedtime. She was agreeable to restart the furosemide. She also does not want to try the Repatha and will remain on her Livalo. She is just hesitant to make changes and is going to get a second opinion from her cardiologist at the [facility] in [place]. Advised I would update provider on her wishes and would call her back to update her with providers recommendations.   Will update pharmacy to disregard referral for repatha assistance.   Fransico Michael, Cadence H, PA-C  You; Annia Belt, RN Just now (10:44 AM)   Ok to restart lasix 40mg  daily as that is what she was on before.  Ok for with eye injection  And it's ok to try statin before repatha

## 2021-01-08 NOTE — Telephone Encounter (Signed)
Spoke to pt, she would like to discuss medication concerns.  Pt seen in office yesterday with Cadence Fransico Michael, [place].  At visit changes made below: STOP Metoprolol succinate START Furosemide (Lasix) 20 mg once daily START Carvediolol 12.5 mg take twice a day STOP Ezetimibe START Repatha. We will send referral to our pharmacist for their assistance with getting this started.  1) Pt wanted to notify Cadence that she reported that she was previously taking Lasix 20 mg daily, after she reviewed meds at home she was actually taking TWO tablets daily (40 mg daily). Per note Lasix previously held d/t CKD. Pt would like to clarify if Cadence wants her to continue on 20 mg daily or 40 mg daily since she reported incorrectly.   2) Pt read drug information on Carvedilol after this was changed from Metoprolol Succinate. Reports warning on insert states to contact provider if you are planning to have eye procedure/surgery. Pt has eye injections every 6 weeks and would like to make sure she should continue with Carvedilol d/t this information.   3) Pt states after reading drug information on Repatha, she is concerned with several side effects, and would like to "give Livalo a chance before starting Repatha." Pt's PCP had initiated low dose Livalo d/t hx of intolerance to several statins. Pt states she would at least like to give Livalo a try before starting Repatha.   Notified pt I will make Cadence aware of her questions/concerns regarding above, and will also include PharmD regarding carvedilol questions. Will call pt back with provider/pharmD recc. Pt voiced understanding.

## 2021-01-12 ENCOUNTER — Emergency Department
Admission: EM | Admit: 2021-01-12 | Discharge: 2021-01-12 | Disposition: A | Discharge status: Home / Self Care | Payer: Medicare Other | Attending: Emergency Medicine | Admitting: Emergency Medicine

## 2021-01-12 ENCOUNTER — Emergency Department: Payer: Medicare Other

## 2021-01-12 ENCOUNTER — Ambulatory Visit: Payer: Medicare Other | Admitting: Medical

## 2021-01-12 ENCOUNTER — Encounter: Payer: Self-pay | Admitting: Emergency Medicine

## 2021-01-12 ENCOUNTER — Other Ambulatory Visit: Payer: Self-pay

## 2021-01-12 DIAGNOSIS — Z87891 Personal history of nicotine dependence: Secondary | ICD-10-CM | POA: Diagnosis not present

## 2021-01-12 DIAGNOSIS — Z79899 Other long term (current) drug therapy: Secondary | ICD-10-CM | POA: Diagnosis not present

## 2021-01-12 DIAGNOSIS — K047 Periapical abscess without sinus: Secondary | ICD-10-CM | POA: Diagnosis not present

## 2021-01-12 DIAGNOSIS — R079 Chest pain, unspecified: Secondary | ICD-10-CM | POA: Diagnosis present

## 2021-01-12 DIAGNOSIS — I129 Hypertensive chronic kidney disease with stage 1 through stage 4 chronic kidney disease, or unspecified chronic kidney disease: Secondary | ICD-10-CM | POA: Diagnosis not present

## 2021-01-12 DIAGNOSIS — E1122 Type 2 diabetes mellitus with diabetic chronic kidney disease: Secondary | ICD-10-CM | POA: Diagnosis not present

## 2021-01-12 DIAGNOSIS — Z794 Long term (current) use of insulin: Secondary | ICD-10-CM | POA: Diagnosis not present

## 2021-01-12 DIAGNOSIS — N183 Chronic kidney disease, stage 3 unspecified: Secondary | ICD-10-CM | POA: Insufficient documentation

## 2021-01-12 LAB — BASIC METABOLIC PANEL
Anion gap: 9 (ref 5–15)
BUN: 38 mg/dL — ABNORMAL HIGH (ref 8–23)
CO2: 24 mmol/L (ref 22–32)
Calcium: 9.5 mg/dL (ref 8.9–10.3)
Chloride: 106 mmol/L (ref 98–111)
Creatinine, Ser: 1.54 mg/dL — ABNORMAL HIGH (ref 0.44–1.00)
GFR, Estimated: 35 mL/min — ABNORMAL LOW (ref >60)
Glucose, Bld: 144 mg/dL — ABNORMAL HIGH (ref 70–99)
Potassium: 4 mmol/L (ref 3.5–5.1)
Sodium: 139 mmol/L (ref 135–145)

## 2021-01-12 LAB — CBC
HCT: 36.4 % (ref 36.0–46.0)
Hemoglobin: 11.8 g/dL — ABNORMAL LOW (ref 12.0–15.0)
MCH: 29.7 pg (ref 26.0–34.0)
MCHC: 32.4 g/dL (ref 30.0–36.0)
MCV: 91.7 fL (ref 80.0–100.0)
Platelets: 252 10*3/uL (ref 150–400)
RBC: 3.97 MIL/uL (ref 3.87–5.11)
RDW: 13.7 % (ref 11.5–15.5)
WBC: 10.1 10*3/uL (ref 4.0–10.5)
nRBC: 0 % (ref 0.0–0.2)

## 2021-01-12 LAB — TROPONIN I (HIGH SENSITIVITY)
Troponin I (High Sensitivity): 7 ng/L (ref <18)
Troponin I (High Sensitivity): 7 ng/L (ref <18)

## 2021-01-12 MED ORDER — NITROGLYCERIN 0.4 MG SL SUBL: 0.4000 mg | SUBLINGUAL_TABLET | SUBLINGUAL | 3 refills | Status: AC | PRN

## 2021-01-12 MED ORDER — AMOXICILLIN 500 MG PO CAPS: 500.0000 mg | ORAL_CAPSULE | Freq: Three times a day (TID) | ORAL | 0 refills | Status: AC

## 2021-01-12 NOTE — ED Notes (Signed)
LAB CALLED TO OBTAIN PT LABS AFTER x2 ATTEMPTS BY GABBY,EDT.

## 2021-01-12 NOTE — ED Provider Notes (Signed)
[facility]  ____________________________________________   Event Date/Time   First MD Initiated Contact with Patient 01/12/21 1037     (approximate)  I have reviewed the triage vital signs and the nursing notes.   HISTORY  Chief Complaint Chest Pain and Weakness    HPI Hannah Thomas is a 75 y.o. female past medical history of diabetes, CAD status post PCI, hypertension, hyperlipidemia who presents with an episode of weakness and chest pain.  Was around 8 AM this morning patient was sitting down when she suddenly felt globally weak extending from her legs up to her head.  She had what she describes as a twinge in her chest.  She took several nitroglycerin pills and her symptoms improved.  She feels back to baseline currently.  Does intermittently still have a twinge in her chest which is not pressure-like, not sharp and without associated symptoms.  She denies shortness of breath.  Tells me that she takes nitroglycerin several times per week for this pain.  She denies fevers, chills.  No nausea or vomiting.  No lower extremity swelling.  Unrelated patient has some swelling of her gums of the right lower teeth with some associated purulence, no fevers or chills.         Past Medical History:  Diagnosis Date   Anemia    CAD in native artery    a. MI ~ 2000 s/p PCI x 2; b. PCI ~ 2015; c. PCI ~ 2020 complicated vy contrast-induced nephropathy; d. PCI 07/2020   CKD (chronic kidney disease)    DM2 (diabetes mellitus, type 2) (HCC)    Essential hypertension    Falls    Hyperlipidemia LDL goal <70    Obesity    Peripheral neuropathy     Patient Active Problem List   Diagnosis Date Noted   Atrial fibrillation with RVR (HCC) 12/27/2020   Atrial fibrillation with rapid ventricular response (HCC) 12/26/2020   CAD in native artery    CKD stage 3 due to type 2 diabetes mellitus (HCC)    Obesity    Essential hypertension    NSTEMI (non-ST elevated myocardial  infarction) [facility])     Past Surgical History:  Procedure Laterality Date   CARDIAC CATHETERIZATION     LEFT HEART CATH AND CORONARY ANGIOGRAPHY N/A 12/29/2020   Procedure: LEFT HEART CATH AND CORONARY ANGIOGRAPHY;  Surgeon: Iran Ouch, MD;  Location: [facility] INVASIVE CV LAB;  Service: Cardiovascular;  Laterality: N/A;    Prior to Admission medications   Medication Sig Start Date End Date Taking? Authorizing Provider  amoxicillin (AMOXIL) 500 MG capsule Take 1 capsule (500 mg total) by mouth 3 (three) times daily for 7 days. 01/12/21 01/19/21 Yes Georga Hacking, MD  nitroGLYCERIN (NITROSTAT) 0.4 MG SL tablet Place 1 tablet (0.4 mg total) under the tongue every 5 (five) minutes as needed for chest pain. 01/12/21 01/12/22 Yes Georga Hacking, MD  acidophilus (RISAQUAD) CAPS capsule Take 1 capsule by mouth daily.    [provider]  amLODipine (NORVASC) 10 MG tablet Take 1 tablet (10 mg total) by mouth daily. 12/29/20 01/28/21  Lynn Ito, MD  apixaban (ELIQUIS) 5 MG TABS tablet Take 1 tablet (5 mg total) by mouth 2 (two) times daily. 12/31/20 01/30/21  Lynn Ito, MD  clopidogrel (PLAVIX) 75 MG tablet Take 75 mg by mouth daily.    [provider]  furosemide (LASIX) 20 MG tablet Take 2 tablets (40 mg total) by mouth daily. 01/08/21 04/08/21  Furth, Cadence H, PA-C  insulin glargine (LANTUS) 100 UNIT/ML Solostar Pen Inject 60 Units into the skin at bedtime.    [provider]  linagliptin (TRADJENTA) 5 MG TABS tablet Take 5 mg by mouth daily.    [provider]  losartan (COZAAR) 50 MG tablet Take 1 tablet (50 mg total) by mouth daily. 12/31/20 01/30/21  Iran Ouch, MD  metoprolol succinate (TOPROL-XL) 100 MG 24 hr tablet Take 0.5 tablets (50 mg total) by mouth at bedtime. 01/08/21   Furth, Cadence H, PA-C  nitroGLYCERIN (NITRODUR - DOSED IN MG/24 HR) 0.2 mg/hr patch Place 0.2 mg onto the skin daily.    [provider]  nitroGLYCERIN (NITROSTAT) 0.4  MG SL tablet Place 0.4 mg under the tongue every 5 (five) minutes x 3 doses as needed for chest pain.    [provider]    Allergies Erythromycin, Allopurinol, Ezetimibe, and Morphine and related  Family History  Problem Relation Age of Onset   Breast cancer Mother    CAD Mother    Hypertension Mother    Migraines Mother    Stroke Mother    Dementia Father    Parkinson's disease Father    Endometrial cancer Sister     Social History Social History   Tobacco Use   Smoking status: Former    Packs/day: 0.00    Types: Cigarettes    Start date: 1965    Quit date: 1977    Years since quitting: 45.6   Smokeless tobacco: Never  Substance Use Topics   Alcohol use: Not Currently   Drug use: Never    Review of Systems   Review of Systems  Constitutional:  Negative for diaphoresis, fatigue and fever.  Respiratory:  Positive for chest tightness. Negative for shortness of breath.   Cardiovascular:  Positive for chest pain. Negative for palpitations and leg swelling.  Gastrointestinal:  Negative for abdominal pain, nausea and vomiting.  Neurological:  Positive for weakness. Negative for light-headedness.  All other systems reviewed and are negative.  Physical Exam Updated Vital Signs BP (!) 148/67 (BP Location: Left Arm)   Pulse 81   Temp 98.2 F (36.8 C) (Oral)   Resp 18   Ht 5\' 3"  (1.6 m)   Wt 114.4 kg   SpO2 98%   BMI 44.68 kg/m   Physical Exam Vitals and nursing note reviewed.  Constitutional:      General: She is not in acute distress.    Appearance: Normal appearance. She is not toxic-appearing.  HENT:     Head: Normocephalic and atraumatic.     Comments: Gingival swelling of the right posterior mandibular molars with an area of purulence No pain with percussion of the tooth    Nose: Nose normal. No congestion.     Mouth/Throat:     Mouth: Mucous membranes are moist.  Eyes:     General: No scleral icterus.    Conjunctiva/sclera: Conjunctivae  normal.  Cardiovascular:     Rate and Rhythm: Normal rate and regular rhythm.  Pulmonary:     Effort: Pulmonary effort is normal. No respiratory distress.     Breath sounds: Normal breath sounds. No stridor. No wheezing.  Abdominal:     Palpations: Abdomen is soft.     Tenderness: There is no abdominal tenderness. There is no guarding.  Musculoskeletal:        General: No swelling, tenderness, deformity or signs of injury. Normal range of motion.     Cervical back:  Normal range of motion and neck supple. No rigidity.  Skin:    General: Skin is warm and dry.     Coloration: Skin is not jaundiced or pale.  Neurological:     General: No focal deficit present.     Mental Status: She is alert and oriented to person, place, and time. Mental status is at baseline.  Psychiatric:        Mood and Affect: Mood normal.        Behavior: Behavior normal.     LABS (all labs ordered are listed, but only abnormal results are displayed)  Labs Reviewed  BASIC METABOLIC PANEL - Abnormal; Notable for the following components:      Result Value   Glucose, Bld 144 (*)    BUN 38 (*)    Creatinine, Ser 1.54 (*)    GFR, Estimated 35 (*)    All other components within normal limits  CBC - Abnormal; Notable for the following components:   Hemoglobin 11.8 (*)    All other components within normal limits  TROPONIN I (HIGH SENSITIVITY)  TROPONIN I (HIGH SENSITIVITY)   ____________________________________________  EKG  Right bundle branch block and left anterior fascicular block, unchanged from prior, normal sinus rhythm, no acute ischemic changes ____________________________________________  RADIOLOGY Ky BarbanI, Macey Wurtz Rose Mcugh, personally viewed and evaluated these images (plain radiographs) as part of my medical decision making, as well as reviewing the written report by the radiologist.  ED MD interpretation: I reviewed the chest x-ray which does not show any acute cardiopulmonary  process    ____________________________________________   PROCEDURES  Procedure(s) performed (including Critical Care):  Procedures   ____________________________________________   INITIAL IMPRESSION / ASSESSMENT AND PLAN / ED COURSE     75 year old female with history of MI presents with a an episode of global weakness as well as some short-lived atypical chest pain.  Her vital signs are within normal limits and she is very well-appearing.  Symptoms seem to have improved although she does describe an ongoing intermittent which she describes as "twinge" in her chest which she has had multiple times in the past.  Her EKG shows a right bundle branch block with a left anterior fascicular block without acute ischemic changes which is unchanged from prior.  Her serial high-sensitivity troponins are less than 10.  Other labs reassuring.  Chest x-ray without acute cardiopulmonary process.  Given her symptoms have improved with her negative serial troponins, will discharge.  Patient has cardiology follow-up scheduled.  We discussed return precautions for worsening symptoms that do not improve.  Patient incidentally with some gingivitis and possible periapical abscess.  Will start on amoxicillin.  Referred to dental.  Clinical Course as of 01/12/21 1354  Mon Jan 12, 2021  1344 Troponin I (High Sensitivity): 7 [KM]    Clinical Course User Index [KM] Georga HackingMcHugh, Hassan Blackshire Rose, MD     ____________________________________________   FINAL CLINICAL IMPRESSION(S) / ED DIAGNOSES  Final diagnoses:  Chest pain, unspecified type  Dental infection     ED Discharge Orders          Ordered    amoxicillin (AMOXIL) 500 MG capsule  3 times daily        01/12/21 1351    nitroGLYCERIN (NITROSTAT) 0.4 MG SL tablet  Every 5 min PRN        01/12/21 1351             Note:  This document was prepared using Dragon voice recognition software and  may include unintentional dictation errors.     Georga Hacking, MD 01/12/21 [phone]

## 2021-01-12 NOTE — Discharge Instructions (Addendum)
Your blood work, EKG and chest x-ray were reassuring today.  If your symptoms return and do not improve, please return to the emergency department.  Please follow-up with your cardiologist within the next several weeks.

## 2021-01-12 NOTE — ED Triage Notes (Signed)
Pt comes into the ED via POC c/o chest pain and generalized weakness.  Pt states this is the same way she felt on her last MI.  Pt took her nitroglycerin at home and pain was relieved.  Pt currently has even and unlabored respirations.  Pt does admit to some SHOB, but denies any dizziness or nausea.

## 2021-01-14 ENCOUNTER — Telehealth: Payer: Self-pay | Admitting: Medical

## 2021-01-14 ENCOUNTER — Other Ambulatory Visit
Admission: RE | Admit: 2021-01-14 | Discharge: 2021-01-14 | Disposition: A | Discharge status: Home / Self Care | Payer: Medicare Other | Attending: Medical | Admitting: Medical

## 2021-01-14 DIAGNOSIS — I2510 Atherosclerotic heart disease of native coronary artery without angina pectoris: Secondary | ICD-10-CM | POA: Insufficient documentation

## 2021-01-14 DIAGNOSIS — I4891 Unspecified atrial fibrillation: Secondary | ICD-10-CM

## 2021-01-14 DIAGNOSIS — N183 Chronic kidney disease, stage 3 unspecified: Secondary | ICD-10-CM | POA: Diagnosis present

## 2021-01-14 DIAGNOSIS — I214 Non-ST elevation (NSTEMI) myocardial infarction: Secondary | ICD-10-CM

## 2021-01-14 DIAGNOSIS — E1122 Type 2 diabetes mellitus with diabetic chronic kidney disease: Secondary | ICD-10-CM | POA: Diagnosis present

## 2021-01-14 DIAGNOSIS — I10 Essential (primary) hypertension: Secondary | ICD-10-CM

## 2021-01-14 DIAGNOSIS — Z79899 Other long term (current) drug therapy: Secondary | ICD-10-CM

## 2021-01-14 LAB — CBC
HCT: 36 % (ref 36.0–46.0)
Hemoglobin: 11.7 g/dL — ABNORMAL LOW (ref 12.0–15.0)
MCH: 29.5 pg (ref 26.0–34.0)
MCHC: 32.5 g/dL (ref 30.0–36.0)
MCV: 90.9 fL (ref 80.0–100.0)
Platelets: 243 10*3/uL (ref 150–400)
RBC: 3.96 MIL/uL (ref 3.87–5.11)
RDW: 13.7 % (ref 11.5–15.5)
WBC: 6.4 10*3/uL (ref 4.0–10.5)
nRBC: 0 % (ref 0.0–0.2)

## 2021-01-14 LAB — BASIC METABOLIC PANEL
Anion gap: 9 (ref 5–15)
BUN: 44 mg/dL — ABNORMAL HIGH (ref 8–23)
CO2: 22 mmol/L (ref 22–32)
Calcium: 9.2 mg/dL (ref 8.9–10.3)
Chloride: 102 mmol/L (ref 98–111)
Creatinine, Ser: 1.72 mg/dL — ABNORMAL HIGH (ref 0.44–1.00)
GFR, Estimated: 30 mL/min — ABNORMAL LOW (ref >60)
Glucose, Bld: 183 mg/dL — ABNORMAL HIGH (ref 70–99)
Potassium: 4.6 mmol/L (ref 3.5–5.1)
Sodium: 133 mmol/L — ABNORMAL LOW (ref 135–145)

## 2021-01-14 MED ORDER — FUROSEMIDE 20 MG PO TABS: 20.0000 mg | ORAL_TABLET | Freq: Every day | ORAL | 0 refills | Status: DC

## 2021-01-14 NOTE — Telephone Encounter (Signed)
Cadence David Stall, PA-C  01/14/2021  2:00 PM EDT     Labs show that she is dehydrated on lasix 40mg  daily. I think we should go back down to 20mg  daily. Re-check BMET in a week. Continue with daily weights and low salt diet.

## 2021-01-14 NOTE — Telephone Encounter (Signed)
I spoke with the patient regarding her lab results and Cadence Furth, PA's recommendations to: 1) Decrease lasix to 20 mg once daily  2) repeat a BMP in 1 week 3) continue to weigh daily and maintain a low sodium diet  The patient voices understanding and is agreeable.  She then inquired about what she should do in regards to her metformin.  She has been off of this recently and advised she was told by Cadence that she could resume metformin when her creatinine was 1.4 or below. She is on insulin & tradjenta, but was concerned about her sugars being off the metformin. Her dosing on metformin has been 1000 mg BID.  She also states for years she has been on the NTG patch 0.2 mg q 12 hours. She states her discharge summary advised her to be on NTG patch 0.2 mg/hr 24 hour patch. She advised she does not have a RX for this.  I advised her I will need to clarify with Cadence what her recommendations are for resumption of metoformin & what NTG patch she needs to be on.  The patient advised she lives in [place] and will be here until 02/26/21.  BMP order placed for 1 week.

## 2021-01-15 NOTE — Telephone Encounter (Signed)
Cadence- Looks like patient has SL, she was asking about the patch per following note:  She also states for years she has been on the NTG patch 0.2 mg q 12 hours. She states her discharge summary advised her to be on NTG patch 0.2 mg/hr 24 hour patch. She advised she does not have a RX for this.   I advised her I will need to clarify with Cadence what her recommendations are for resumption of metoformin & what NTG patch she needs to be on.  Did you want to order a patch for her, or have her only get the SL?

## 2021-01-15 NOTE — Telephone Encounter (Signed)
Eara, [place]el - 01/14/2021  2:28 PM Fransico Michael, Cadence H, PA-C  Sent: Thu January 15, 2021 10:16 AM  To: Jefferey Pica, RN          Message  Thanks for the update  I did not give recommendations regarding metformin, would follow-up with PCP for this since it's a non-cardiac medication. I think I mentioned her baseline Scr is around 1.4, but would ultimately defer diabetes meds to PCP  It is OK to send in script for SL NTG  Thanks

## 2021-01-16 NOTE — Telephone Encounter (Signed)
Attempted to reach out to pt, unable to reach via phone Left message advising cannot order Nitro patch Provider Cadence Fransico Michael, PA-C order Nitro tabs  We sent in Nitroglycerin 0.4mg  [facility] [place]   For as needed Nitroglycerin, if you develop chest pain: Sit and rest 5 minutes. If chest pain does not resolve place 1 nitroglycerin under your tongue and wait 5 minutes. If chest pain does not resolve, place a 2nd nitroglycerin under your tongue and wait 5 more minutes. If chest pain does not resolve, place a 3rd nitroglycerin under your tongue and seek emergency services.  Advised to call back with further concerns or questions.

## 2021-01-19 ENCOUNTER — Encounter: Payer: Medicare Other | Attending: Cardiovascular Disease | Admitting: *Deleted

## 2021-01-19 ENCOUNTER — Other Ambulatory Visit: Payer: Self-pay

## 2021-01-19 DIAGNOSIS — I214 Non-ST elevation (NSTEMI) myocardial infarction: Secondary | ICD-10-CM

## 2021-01-19 NOTE — Progress Notes (Signed)
Initial telephone orientation completed. Diagnosis can be found in [facility] 8/8. EP orientation scheduled for Monday 9/12 at 9am.

## 2021-01-21 ENCOUNTER — Telehealth: Payer: Self-pay | Admitting: Medical

## 2021-01-21 ENCOUNTER — Other Ambulatory Visit
Admission: RE | Admit: 2021-01-21 | Discharge: 2021-01-21 | Disposition: A | Discharge status: Home / Self Care | Payer: Medicare Other | Source: Ambulatory Visit | Attending: Medical | Admitting: Medical

## 2021-01-21 DIAGNOSIS — I10 Essential (primary) hypertension: Secondary | ICD-10-CM | POA: Diagnosis present

## 2021-01-21 DIAGNOSIS — E1122 Type 2 diabetes mellitus with diabetic chronic kidney disease: Secondary | ICD-10-CM | POA: Diagnosis present

## 2021-01-21 DIAGNOSIS — Z79899 Other long term (current) drug therapy: Secondary | ICD-10-CM | POA: Diagnosis present

## 2021-01-21 DIAGNOSIS — I4891 Unspecified atrial fibrillation: Secondary | ICD-10-CM | POA: Diagnosis present

## 2021-01-21 DIAGNOSIS — N183 Chronic kidney disease, stage 3 unspecified: Secondary | ICD-10-CM | POA: Insufficient documentation

## 2021-01-21 LAB — BASIC METABOLIC PANEL
Anion gap: 7 (ref 5–15)
BUN: 37 mg/dL — ABNORMAL HIGH (ref 8–23)
CO2: 25 mmol/L (ref 22–32)
Calcium: 9.4 mg/dL (ref 8.9–10.3)
Chloride: 107 mmol/L (ref 98–111)
Creatinine, Ser: 1.56 mg/dL — ABNORMAL HIGH (ref 0.44–1.00)
GFR, Estimated: 34 mL/min — ABNORMAL LOW (ref >60)
Glucose, Bld: 158 mg/dL — ABNORMAL HIGH (ref 70–99)
Potassium: 4.6 mmol/L (ref 3.5–5.1)
Sodium: 139 mmol/L (ref 135–145)

## 2021-01-21 NOTE — Telephone Encounter (Signed)
Furth, Cadence H, PA-C  P Cv Div [facility] Triage Labs stable. Continue lasix dose

## 2021-01-21 NOTE — Telephone Encounter (Signed)
Patient made aware of lab results. Patient would like to know if based on her current lab results should she resume Metformin? Her Losartan was reduced to 50 mg daily due to her kidney function. Her BP has been elevated on the lower dose of Losartan. She was previously on 100 mg daily. Patient would like to know if Losartan should be increased back to 100 mg daily? She previously declined to switch from Metoprolol to Carvedilol for better BP control. Advised the patient that I will fwd the message to Dr.

## 2021-01-22 NOTE — Telephone Encounter (Signed)
Patient made aware of Dr. Jari Sportsman response an d recommendation. Patient will resume Metformin 1000 mg bid (not prescribed by cardiology) as she was previously prescribed prior to her recent hospitalization. She will remain on losartan 50 mg daily. Patient verbalized understanding and voiced appreciation for the call. She will f/u on 02/09/21 as planned with Cadence Fransico Michael, [place]

## 2021-01-22 NOTE — Telephone Encounter (Signed)
She can resume metformin but losartan should be kept at 50 mg daily.

## 2021-01-27 ENCOUNTER — Telehealth: Payer: Self-pay | Admitting: Medical

## 2021-01-27 MED ORDER — APIXABAN 5 MG PO TABS: 5.0000 mg | ORAL_TABLET | Freq: Two times a day (BID) | ORAL | 1 refills | Status: AC

## 2021-01-27 NOTE — Telephone Encounter (Signed)
Spoke to the pt.  Pt reports "I woke up last night with heart pounding and I became very sweaty." Pt took SL NTG x 1. Symptoms resolved.   This morning pt c/o pain in left side of chest with "some shortness of breath and my legs felt heavy." Pt took SL NTG x 3. Chest pain and SOB resolved after third NTG.   BP/HR within the last hour 144/89  73.  Pt states even during s/s including last night "my heart rate has remained in the 70s." Pt keeps track of HR on her apple watch.  Pt states "I haven't felt well all week and I am not sure if I am in a fib."  Pt seen for s/p hospital discharge in office 8/17 with Cadence Fransico Michael, NP.  New onset a fib in hospital, PMHx also includes h/o CAD s/p multiple PCI. Pt also seen again in ER 8/22 for chest pain and weakness.  Pt was discharged from ER with Nitroglycerin (and Amoxicillin for gingivitis/possible abscess).   Pt denies chest pain or SOB now. Pt feels stable at this time.  Pt concerned and would like to know if she is in a fib. Scheduled pt to see DOD tomorrow (Dr. Okey Dupre) 01/28/21 at 9:40 AM. Advised pt if s/s worsen and/or becomes unstable to call 911. Pt voiced understanding.   Pt also requests refills of Eliquis, Amlodipine, and Losartan while on phone.  Advised pt I will send Eliquis refill request to anti-coag pool per protocol, however, since she does have enough on hand of Losartan and Amlodipine, will wait for ov tomorrow in case of any dose changes are made.  Pt voiced understanding of this.

## 2021-01-27 NOTE — Telephone Encounter (Signed)
Prescription refill request for Eliquis received. Indication:afib Last office visit:furth 01/07/21 Scr:1.56 01/21/21 Age: 18f [id number]

## 2021-01-27 NOTE — Telephone Encounter (Signed)
Patient c/o Palpitations:  High priority if patient c/o lightheadedness, shortness of breath, or chest pain  How long have you had palpitations/irregular HR/ Afib? Are you having the symptoms now?  Yes, was up all night  Are you currently experiencing lightheadedness, SOB or CP? SOB   Do you have a history of afib (atrial fibrillation) or irregular heart rhythm? Yes.  Have you checked your BP or HR? (document readings if available):   Are you experiencing any other symptoms? States she has heaviness in her chest. States she doesn't feel right. States she took a Nitro this morning and then went to sleep. States she also took a Nitro last week

## 2021-01-28 ENCOUNTER — Ambulatory Visit (INDEPENDENT_AMBULATORY_CARE_PROVIDER_SITE_OTHER): Payer: Medicare Other

## 2021-01-28 ENCOUNTER — Ambulatory Visit (INDEPENDENT_AMBULATORY_CARE_PROVIDER_SITE_OTHER): Payer: Medicare Other | Admitting: Internal Medicine

## 2021-01-28 ENCOUNTER — Telehealth: Payer: Self-pay | Admitting: Internal Medicine

## 2021-01-28 ENCOUNTER — Encounter: Payer: Self-pay | Admitting: Internal Medicine

## 2021-01-28 ENCOUNTER — Other Ambulatory Visit: Payer: Self-pay

## 2021-01-28 VITALS — BP 160/68 | HR 73 | Ht 63.0 in | Wt 247.0 lb

## 2021-01-28 DIAGNOSIS — I5032 Chronic diastolic (congestive) heart failure: Secondary | ICD-10-CM | POA: Diagnosis not present

## 2021-01-28 DIAGNOSIS — I480 Paroxysmal atrial fibrillation: Secondary | ICD-10-CM

## 2021-01-28 DIAGNOSIS — E1169 Type 2 diabetes mellitus with other specified complication: Secondary | ICD-10-CM | POA: Diagnosis not present

## 2021-01-28 DIAGNOSIS — G4733 Obstructive sleep apnea (adult) (pediatric): Secondary | ICD-10-CM

## 2021-01-28 DIAGNOSIS — Z79899 Other long term (current) drug therapy: Secondary | ICD-10-CM

## 2021-01-28 DIAGNOSIS — I25118 Atherosclerotic heart disease of native coronary artery with other forms of angina pectoris: Secondary | ICD-10-CM | POA: Diagnosis not present

## 2021-01-28 DIAGNOSIS — E785 Hyperlipidemia, unspecified: Secondary | ICD-10-CM

## 2021-01-28 MED ORDER — NITROGLYCERIN 0.4 MG/HR TD PT24: 0.4000 mg | MEDICATED_PATCH | Freq: Every day | TRANSDERMAL | 1 refills | Status: DC

## 2021-01-28 MED ORDER — FUROSEMIDE 20 MG PO TABS: 40.0000 mg | ORAL_TABLET | Freq: Every day | ORAL | 3 refills | Status: AC

## 2021-01-28 MED ORDER — LOSARTAN POTASSIUM 50 MG PO TABS: 50.0000 mg | ORAL_TABLET | Freq: Every day | ORAL | 2 refills | Status: AC

## 2021-01-28 MED ORDER — AMLODIPINE BESYLATE 10 MG PO TABS: 10.0000 mg | ORAL_TABLET | Freq: Every day | ORAL | 2 refills | Status: AC

## 2021-01-28 MED ORDER — FUROSEMIDE 20 MG PO TABS: 20.0000 mg | ORAL_TABLET | Freq: Every day | ORAL | 3 refills | Status: DC

## 2021-01-28 NOTE — Telephone Encounter (Signed)
Spoke with pt.  Pt reports that after she returned home, she realized that she actually has been taking Lasix 20 mg daily.  Pt reported in office she was taking Lasix 10 mg.   Please advise.

## 2021-01-28 NOTE — Patient Instructions (Signed)
Medication Instructions:   Your physician has recommended you make the following change in your medication:   1) INCREASE Furosemide (Lasix) 20 mg daily  2) INCREASE Nitroglycerin patch dose to 0.4mg /hr - continue to place patch on skin daily and remove before bed  Refills have been sent to your pharmacy for Amlodipine and Losartan  *If you need a refill on your cardiac medications before your next appointment, please call your pharmacy*   Lab Work:  None ordered  Testing/Procedures:  Your physician has recommended that you wear a Zio XT monitor for TWO WEEKS.   This monitor is a medical device that records the heart's electrical activity. Doctors most often use these monitors to diagnose arrhythmias. Arrhythmias are problems with the speed or rhythm of the heartbeat. The monitor is a small device applied to your chest. You can wear one while you do your normal daily activities. While wearing this monitor if you have any symptoms to push the button and record what you felt. Once you have worn this monitor for the period of time provider prescribed (Usually 14 days), you will return the monitor device in the postage paid box. Once it is returned they will download the data collected and provide [place] with a report which the provider will then review and we will call you with those results. Important tips:  Avoid showering during the first 24 hours of wearing the monitor. Avoid excessive sweating to help maximize wear time. Do not submerge the device, no hot tubs, and no swimming pools. Keep any lotions or oils away from the patch. After 24 hours you may shower with the patch on. Take brief showers with your back facing the shower head.  Do not remove patch once it has been placed because that will interrupt data and decrease adhesive wear time. Push the button when you have any symptoms and write down what you were feeling. Once you have completed wearing your monitor, remove and place into  box which has postage paid and place in your outgoing mailbox.  If for some reason you have misplaced your box then call our office and we can provide another box and/or mail it off for you.     Follow-Up: At [facility], you and your health needs are our priority.  As part of our continuing mission to provide you with exceptional heart care, we have created designated Provider Care Teams.  These Care Teams include your primary Cardiologist (physician) and Advanced Practice Providers (APPs -  Physician Assistants and Nurse Practitioners) who all work together to provide you with the care you need, when you need it.  We recommend signing up for the patient portal called "MyChart".  Sign up information is provided on this After Visit Summary.  MyChart is used to connect with patients for Virtual Visits (Telemedicine).  Patients are able to view lab/test results, encounter notes, upcoming appointments, etc.  Non-urgent messages can be sent to your provider as well.   To learn more about what you can do with MyChart, go to [email or web address].    Your next appointment:   3 - 4 week(s)  The format for your next appointment:   In Person  Provider:   You may see Dr. Kirke Corin or one of the following Advanced Practice Providers on your designated Care Team:    Nicolasa Ducking, NP Eula Listen, PA-C Marisue Ivan, PA-C Cadence Oden, [place]

## 2021-01-28 NOTE — Telephone Encounter (Signed)
Given confusion around dosing, let's have Hannah Thomas take furosemide 40 mg PO daily with repeat BMP in 1 week.  Thanks.  Yvonne Kendall, MD [facility]

## 2021-01-28 NOTE — Progress Notes (Signed)
Follow-up Outpatient Visit Date: 01/28/2021  Primary Care Provider: Pcp, No No address on file  Primary Cardiologist: Rachel Moulds, MD  Chief Complaint: Chest pain and palpitations  HPI:  Hannah Thomas is a 75 y.o. female with history of coronary artery disease status post multiple PCI's, recently diagnosed atrial fibrillation, type 2 diabetes mellitus complicated by peripheral nephropathy, hypertension, hyperlipidemia with statin intolerance, chronic kidney disease stage III, PAD, morbid obesity, falls, and suspected sleep apnea, who presents for urgent evaluation of chest pain and shortness of breath.  She was hospitalized in early August with chest pain in the setting of atrial fibrillation with rapid ventricular response.  Catheterization showed chronic total occlusion of the LCx and otherwise nonobstructive disease.  This was managed medically.  She was seen for follow-up by Cadence Furth, PA, on 01/07/2021, at which time she complained of fluid retention after discontinuation of her diuretics.  She was noted to be in sinus rhythm with marked hypertension (blood pressure 194/64).  She was restarted on furosemide 20 mg daily and transition from metoprolol succinate to carvedilol 12.5 mg twice daily.  Due to to her creatinine being elevated a little bit above baseline, she was advised to decrease furosemide to 10 mg daily.  She was also switched to Repatha for improved lipid control in the setting of her statin intolerance.  She presented to the [facility] emergency department on 01/12/2021 complaining of weakness and chest pain.  She was noted to be in sinus rhythm at that time without acute EKG changes.  High-sensitivity troponin and I was negative.  Incidental note was made of gingivitis and possible periapical abscess prompting referral to dentistry and initiation of amoxicillin.  She reached out to our office yesterday complaining of shortness of breath and irregular heartbeat concerning for recurrent  atrial fibrillation.  Today, [name] reports that she has been experiencing episodic palpitations with associated chest pain over the last few weeks.  At times, she does not have chest pain but just feels sweaty or develops heaviness in her arms.  The symptoms do not always correspond to when she is feeling palpitations.  She sometimes monitors her heart rate on an apple watch and notes that it has not been high or erratic.  She will take sublingual nitroglycerin during these episodes with prompt relief of her pain.  She continues to use her nitroglycerin patch, as she was previously intolerant of isosorbide mononitrate.  She wonders if increased stress related to her upcoming move back to [place] is contributing to her symptoms.  She also notes a diagnosis of sleep apnea based on a home sleep test this spring while in [place].  She was not able to undergo further evaluation by a sleep specialist, as she was traveling back to [place].  --------------------------------------------------------------------------------------------------  Past Medical History:  Diagnosis Date   Anemia    CAD in native artery    a. MI ~ 2000 s/p PCI x 2; b. PCI ~ 2015; c. PCI ~ 2020 complicated vy contrast-induced nephropathy; d. PCI 07/2020   CKD (chronic kidney disease)    DM2 (diabetes mellitus, type 2) (HCC)    Essential hypertension    Falls    Hyperlipidemia LDL goal <70    Obesity    Peripheral neuropathy    Past Surgical History:  Procedure Laterality Date   CARDIAC CATHETERIZATION     LEFT HEART CATH AND CORONARY ANGIOGRAPHY N/A 12/29/2020   Procedure: LEFT HEART CATH AND CORONARY ANGIOGRAPHY;  Surgeon: Iran Ouch,  MD;  Location: [facility] INVASIVE CV LAB;  Service: Cardiovascular;  Laterality: N/A;     Current Meds  Medication Sig   acidophilus (RISAQUAD) CAPS capsule Take 1 capsule by mouth daily.   amLODipine (NORVASC) 10 MG tablet Take 1 tablet (10 mg total) by mouth daily.   apixaban  (ELIQUIS) 5 MG TABS tablet Take 1 tablet (5 mg total) by mouth 2 (two) times daily.   clopidogrel (PLAVIX) 75 MG tablet Take 75 mg by mouth daily.   furosemide (LASIX) 20 MG tablet Take 10 mg by mouth daily.   insulin glargine (LANTUS) 100 UNIT/ML Solostar Pen Inject 60 Units into the skin at bedtime.   linagliptin (TRADJENTA) 5 MG TABS tablet Take 5 mg by mouth daily.   losartan (COZAAR) 50 MG tablet Take 1 tablet (50 mg total) by mouth daily.   metoprolol succinate (TOPROL-XL) 100 MG 24 hr tablet Take 0.5 tablets (50 mg total) by mouth at bedtime.   Multiple Vitamins-Minerals (OCUVITE PRESERVISION PO) Take by mouth 2 (two) times daily.   nitroGLYCERIN (NITRODUR - DOSED IN MG/24 HR) 0.2 mg/hr patch Place 0.2 mg onto the skin daily.   nitroGLYCERIN (NITROSTAT) 0.4 MG SL tablet Place 1 tablet (0.4 mg total) under the tongue every 5 (five) minutes as needed for chest pain.    Allergies: Erythromycin, Isosorbide, Ezetimibe, and Morphine and related  Social History   Tobacco Use   Smoking status: Former    Packs/day: 0.00    Types: Cigarettes    Start date: 1965    Quit date: 1977    Years since quitting: 45.7   Smokeless tobacco: Never  Substance Use Topics   Alcohol use: Not Currently   Drug use: Never    Family History  Problem Relation Age of Onset   Breast cancer Mother    CAD Mother    Hypertension Mother    Migraines Mother    Stroke Mother    Dementia Father    Parkinson's disease Father    Endometrial cancer Sister     Review of Systems: A 12-system review of systems was performed and was negative except as noted in the HPI.  --------------------------------------------------------------------------------------------------  Physical Exam: BP (!) 160/68 (BP Location: Left Arm, Patient Position: Sitting, Cuff Size: Large)   Pulse 73   Ht 5\' 3"  (1.6 m)   Wt 247 lb (112 kg)   SpO2 97%   BMI 43.75 kg/m   General:  NAD. Neck: No gross JVD, though body habitus  limits evaluation. Lungs: Clear to auscultation bilaterally without wheezes or crackles. Heart: Regular rate and rhythm without murmurs, rubs, or gallops. Abdomen: Soft, nontender, nondistended. Extremities: Trace pretibial edema bilaterally.  EKG: Normal sinus rhythm with bifascicular block (RBBB and LAFB) and LVH.  No significant change from prior tracing on 01/12/2021.  Lab Results  Component Value Date   WBC 6.4 01/14/2021   HGB 11.7 (L) 01/14/2021   HCT 36.0 01/14/2021   MCV 90.9 01/14/2021   PLT 243 01/14/2021    Lab Results  Component Value Date   NA 139 01/21/2021   K 4.6 01/21/2021   CL 107 01/21/2021   CO2 25 01/21/2021   BUN 37 (H) 01/21/2021   CREATININE 1.56 (H) 01/21/2021   GLUCOSE 158 (H) 01/21/2021   ALT 15 12/26/2020    Lab Results  Component Value Date   CHOL 216 (H) 12/27/2020   HDL 34 (L) 12/27/2020   LDLCALC 113 (H) 12/27/2020   TRIG 343 (H) 12/27/2020  CHOLHDL 6.4 12/27/2020    --------------------------------------------------------------------------------------------------  ASSESSMENT AND PLAN: Coronary artery disease with stable angina: Ms. Louissaint continues to have episodic chest pain as well as diaphoresis and arm heaviness that has been present since her hospitalization last month.  Catheterization at that time showed chronic total occlusion of the LCx and patent LAD and RCA stents.  I wonder if PAF could be contributing to demand ischemia, particularly given her report of intermittent palpitations.  We have agreed to increase her nitroglycerin patch to 0.4 mg/hr and to obtain a 14-day event monitor.  Current doses of metoprolol and amlodipine will be continued.  Chronic HFpEF: Echocardiogram during recent hospital visit showed preserved LVEF.  Trace pedal edema noted on exam today.  Creatinine back at baseline on last check a week ago.  We will plan to increase furosemide to 20 mg daily.  Paroxysmal atrial fibrillation: Intermittent  palpitations reported though home heart rate readings have been fairly stable.  We will obtain a 14-day event monitor for further characterization.  Continue current doses of metoprolol and apixaban.  Hyperlipidemia associated with type 2 diabetes mellitus: Patient recently started on Repatha.  She will need follow-up lipid panel in 2 to 3 months to ensure appropriate response.  Obstructive sleep apnea: Reportedly diagnosed via home sleep study while the patient was still residing in [place] earlier this year.  She is planning to return to [place] in 1 month.  I have asked her to reach out to her doctor in [place] for referral to a sleep specialist.  Morbid obesity: BMI greater than 40 with multiple comorbidities.  Patient would benefit from weight loss.  Follow-up: Return to clinic in 3-4 weeks with Dr. Kirke Corin or APP (shortly before trip to [facility]).  Yvonne Kendall, MD 01/28/2021 9:48 AM

## 2021-01-28 NOTE — Telephone Encounter (Signed)
Pt c/o medication issue:  1. Name of Medication: furosemide   2. How are you currently taking this medication (dosage and times per day)? 20 mg po q d   3. Are you having a reaction (difficulty breathing--STAT)? no  4. What is your medication issue? Ov today increased dose but patient now reporting her previous dose was 20 mg not 10 as she thought in office   Please call to discuss increase as current rx today would not be a change

## 2021-01-28 NOTE — Telephone Encounter (Signed)
Spoke to Hannah Thomas. Notified Dr. Serita Kyle recc below.  Hannah Thomas voiced understanding.  She will take 2 tablets of Lasix 20 mg for total of 40 mg daily.  Hannah Thomas is scheduled for repeat BMET in our office 02/04/21 at 2:00 PM.  Med list updated.  Hannah Thomas has no further questions at this time.

## 2021-02-02 ENCOUNTER — Other Ambulatory Visit: Payer: Self-pay

## 2021-02-02 ENCOUNTER — Encounter: Payer: Medicare Other | Attending: Cardiovascular Disease | Admitting: *Deleted

## 2021-02-02 VITALS — Ht 64.0 in | Wt 249.9 lb

## 2021-02-02 DIAGNOSIS — I214 Non-ST elevation (NSTEMI) myocardial infarction: Secondary | ICD-10-CM | POA: Diagnosis present

## 2021-02-02 NOTE — Patient Instructions (Signed)
Patient Instructions  Patient Details  Name: Hannah Thomas MRN: [id number] Date of Birth: [birth year] Referring Provider:  Iran Ouch, MD  Below are your personal goals for exercise, nutrition, and risk factors. Our goal is to help you stay on track towards obtaining and maintaining these goals. We will be discussing your progress on these goals with you throughout the program.  Initial Exercise Prescription:  Initial Exercise Prescription - 02/02/21 1300       Date of Initial Exercise RX and Referring Provider   Date 02/02/21    Referring Provider Lorine Bears      Oxygen   Maintain Oxygen Saturation 88% or higher      Treadmill   MPH 1    Grade 0.5    Minutes 15    METs 1.8      NuStep   Level 1    SPM 80    Minutes 15    METs 1.5      REL-XR   Level 1    Speed 50    Minutes 15    METs 1.5      Track   Laps 17    Minutes 15    METs 1.92      Prescription Details   Frequency (times per week) 3    Duration Progress to 30 minutes of continuous aerobic without signs/symptoms of physical distress      Intensity   THRR 40-80% of Max Heartrate 103-130    Ratings of Perceived Exertion 11-13    Perceived Dyspnea 0-4      Progression   Progression Continue to progress workloads to maintain intensity without signs/symptoms of physical distress.      Resistance Training   Training Prescription Yes    Weight 3 lb    Reps 10-15             Exercise Goals: Frequency: Be able to perform aerobic exercise two to three times per week in program working toward 2-5 days per week of home exercise.  Intensity: Work with a perceived exertion of 11 (fairly light) - 15 (hard) while following your exercise prescription.  We will make changes to your prescription with you as you progress through the program.   Duration: Be able to do 30 to 45 minutes of continuous aerobic exercise in addition to a 5 minute warm-up and a 5 minute cool-down routine.   Nutrition  Goals: Your personal nutrition goals will be established when you do your nutrition analysis with the dietician.  The following are general nutrition guidelines to follow: Cholesterol < 200mg /day Sodium < 1500mg /day Fiber: Women over 50 yrs - 21 grams per day  Personal Goals:  Personal Goals and Risk Factors at Admission - 02/02/21 1352       Core Components/Risk Factors/Patient Goals on Admission    Weight Management Yes;Obesity;Weight Loss    Intervention Weight Management: Develop a combined nutrition and exercise program designed to reach desired caloric intake, while maintaining appropriate intake of nutrient and fiber, sodium and fats, and appropriate energy expenditure required for the weight goal.;Weight Management: Provide education and appropriate resources to help participant work on and attain dietary goals.;Weight Management/Obesity: Establish reasonable short term and long term weight goals.;Obesity: Provide education and appropriate resources to help participant work on and attain dietary goals.    Admit Weight 249 lb 14.4 oz (113.4 kg)    Goal Weight: Short Term 245 lb (111.1 kg)    Goal Weight: Long Term 240 lb (108.9  kg)    Expected Outcomes Short Term: Continue to assess and modify interventions until short term weight is achieved;Long Term: Adherence to nutrition and physical activity/exercise program aimed toward attainment of established weight goal;Weight Loss: Understanding of general recommendations for a balanced deficit meal plan, which promotes 1-2 lb weight loss per week and includes a negative energy balance of [phone] kcal/d;Understanding recommendations for meals to include 15-35% energy as protein, 25-35% energy from fat, 35-60% energy from carbohydrates, less than 200mg  of dietary cholesterol, 20-35 gm of total fiber daily;Understanding of distribution of calorie intake throughout the day with the consumption of 4-5 meals/snacks    Diabetes Yes    Intervention  Provide education about signs/symptoms and action to take for hypo/hyperglycemia.;Provide education about proper nutrition, including hydration, and aerobic/resistive exercise prescription along with prescribed medications to achieve blood glucose in normal ranges: Fasting glucose 65-99 mg/dL    Expected Outcomes Short Term: Participant verbalizes understanding of the signs/symptoms and immediate care of hyper/hypoglycemia, proper foot care and importance of medication, aerobic/resistive exercise and nutrition plan for blood glucose control.;Long Term: Attainment of HbA1C < 7%.    Hypertension Yes    Intervention Provide education on lifestyle modifcations including regular physical activity/exercise, weight management, moderate sodium restriction and increased consumption of fresh fruit, vegetables, and low fat dairy, alcohol moderation, and smoking cessation.;Monitor prescription use compliance.    Expected Outcomes Short Term: Continued assessment and intervention until BP is < 140/41mm HG in hypertensive participants. < 130/1mm HG in hypertensive participants with diabetes, heart failure or chronic kidney disease.;Long Term: Maintenance of blood pressure at goal levels.    Lipids Yes    Intervention Provide education and support for participant on nutrition & aerobic/resistive exercise along with prescribed medications to achieve LDL 70mg , HDL >40mg .    Expected Outcomes Short Term: Participant states understanding of desired cholesterol values and is compliant with medications prescribed. Participant is following exercise prescription and nutrition guidelines.;Long Term: Cholesterol controlled with medications as prescribed, with individualized exercise RX and with personalized nutrition plan. Value goals: LDL < 70mg , HDL > 40 mg.             Tobacco Use Initial Evaluation: Social History   Tobacco Use  Smoking Status Former   Packs/day: 0.00   Types: Cigarettes   Start date: 1965    Quit date: 1977   Years since quitting: 45.7  Smokeless Tobacco Never    Exercise Goals and Review:  Exercise Goals     Row Name 02/02/21 1350             Exercise Goals   Increase Physical Activity Yes       Intervention Provide advice, education, support and counseling about physical activity/exercise needs.;Develop an individualized exercise prescription for aerobic and resistive training based on initial evaluation findings, risk stratification, comorbidities and participant's personal goals.       Expected Outcomes Short Term: Attend rehab on a regular basis to increase amount of physical activity.;Long Term: Add in home exercise to make exercise part of routine and to increase amount of physical activity.;Long Term: Exercising regularly at least 3-5 days a week.       Increase Strength and Stamina Yes       Intervention Provide advice, education, support and counseling about physical activity/exercise needs.;Develop an individualized exercise prescription for aerobic and resistive training based on initial evaluation findings, risk stratification, comorbidities and participant's personal goals.       Expected Outcomes Short Term: Increase workloads from  initial exercise prescription for resistance, speed, and METs.;Short Term: Perform resistance training exercises routinely during rehab and add in resistance training at home;Long Term: Improve cardiorespiratory fitness, muscular endurance and strength as measured by increased METs and functional capacity ( )       Able to understand and use rate of perceived exertion (RPE) scale Yes       Intervention Provide education and explanation on how to use RPE scale       Expected Outcomes Short Term: Able to use RPE daily in rehab to express subjective intensity level;Long Term:  Able to use RPE to guide intensity level when exercising independently       Able to understand and use Dyspnea scale Yes       Intervention Provide education and  explanation on how to use Dyspnea scale       Expected Outcomes Long Term: Able to use Dyspnea scale to guide intensity level when exercising independently;Short Term: Able to use Dyspnea scale daily in rehab to express subjective sense of shortness of breath during exertion       Knowledge and understanding of Target Heart Rate Range (THRR) Yes       Intervention Provide education and explanation of THRR including how the numbers were predicted and where they are located for reference       Expected Outcomes Short Term: Able to state/look up THRR;Short Term: Able to use daily as guideline for intensity in rehab;Long Term: Able to use THRR to govern intensity when exercising independently       Able to check pulse independently Yes       Intervention Provide education and demonstration on how to check pulse in carotid and radial arteries.;Review the importance of being able to check your own pulse for safety during independent exercise       Expected Outcomes Short Term: Able to explain why pulse checking is important during independent exercise;Long Term: Able to check pulse independently and accurately       Understanding of Exercise Prescription Yes       Intervention Provide education, explanation, and written materials on patient's individual exercise prescription       Expected Outcomes Short Term: Able to explain program exercise prescription;Long Term: Able to explain home exercise prescription to exercise independently                Copy of goals given to participant.

## 2021-02-02 NOTE — Progress Notes (Signed)
Cardiac Individual Treatment Plan  Patient Details  Name: Hannah Thomas MRN: [id number] Date of Birth: Dec 02, 1945 Referring Provider:   Flowsheet Row Cardiac Rehab from 02/02/2021 in [facility] Cardiac and Pulmonary Rehab  Referring Provider Lorine Bears       Initial Encounter Date:  Flowsheet Row Cardiac Rehab from 02/02/2021 in [facility] Cardiac and Pulmonary Rehab  Date 02/02/21       Visit Diagnosis: NSTEMI (non-ST elevated myocardial infarction) [facility])  Patient's Home Medications on Admission:  Current Outpatient Medications:    acidophilus (RISAQUAD) CAPS capsule, Take 1 capsule by mouth daily., Disp: , Rfl:    amLODipine (NORVASC) 10 MG tablet, Take 1 tablet (10 mg total) by mouth daily., Disp: 90 tablet, Rfl: 2   apixaban (ELIQUIS) 5 MG TABS tablet, Take 1 tablet (5 mg total) by mouth 2 (two) times daily., Disp: 180 tablet, Rfl: 1   clopidogrel (PLAVIX) 75 MG tablet, Take 75 mg by mouth daily., Disp: , Rfl:    furosemide (LASIX) 20 MG tablet, Take 2 tablets (40 mg total) by mouth daily., Disp: 90 tablet, Rfl: 3   insulin glargine (LANTUS) 100 UNIT/ML Solostar Pen, Inject 60 Units into the skin at bedtime., Disp: , Rfl:    linagliptin (TRADJENTA) 5 MG TABS tablet, Take 5 mg by mouth daily., Disp: , Rfl:    losartan (COZAAR) 50 MG tablet, Take 1 tablet (50 mg total) by mouth daily., Disp: 90 tablet, Rfl: 2   metFORMIN (GLUCOPHAGE) 1000 MG tablet, Take 1,000 mg by mouth 2 (two) times daily with a meal. (Patient not taking: Reported on 01/28/2021), Disp: , Rfl:    metoprolol succinate (TOPROL-XL) 100 MG 24 hr tablet, Take 0.5 tablets (50 mg total) by mouth at bedtime., Disp: , Rfl:    Multiple Vitamins-Minerals (OCUVITE PRESERVISION PO), Take by mouth 2 (two) times daily., Disp: , Rfl:    nitroGLYCERIN (NITRODUR - DOSED IN MG/24 HR) 0.4 mg/hr patch, Place 1 patch (0.4 mg total) onto the skin daily., Disp: 90 patch, Rfl: 1   nitroGLYCERIN (NITROSTAT) 0.4 MG SL tablet, Place 1 tablet (0.4 mg  total) under the tongue every 5 (five) minutes as needed for chest pain., Disp: 100 tablet, Rfl: 3  Past Medical History: Past Medical History:  Diagnosis Date   Anemia    CAD in native artery    a. MI ~ 2000 s/p PCI x 2; b. PCI ~ 2015; c. PCI ~ 2020 complicated vy contrast-induced nephropathy; d. PCI 07/2020   CKD (chronic kidney disease)    DM2 (diabetes mellitus, type 2) (HCC)    Essential hypertension    Falls    Hyperlipidemia LDL goal <70    Obesity    Peripheral neuropathy     Tobacco Use: Social History   Tobacco Use  Smoking Status Former   Packs/day: 0.00   Types: Cigarettes   Start date: 1965   Quit date: 1977   Years since quitting: 45.7  Smokeless Tobacco Never    Labs: Recent Review Geneticist, molecular for ITP Cardiac and Pulmonary Rehab Latest Ref Rng & Units 12/26/2020 12/27/2020   Cholestrol 0 - 200 mg/dL - 409(W)   LDLCALC 0 - 99 mg/dL - 119(J)   HDL >47 mg/dL - 82(N)   Trlycerides <562 mg/dL - 130(Q)   Hemoglobin M5H 4.8 - 5.6 % 6.7(H) -        Exercise Target Goals: Exercise Program Goal: Individual exercise prescription set using results from initial 6 min walk  test and THRR while considering  patient's activity barriers and safety.   Exercise Prescription Goal: Initial exercise prescription builds to 30-45 minutes a day of aerobic activity, 2-3 days per week.  Home exercise guidelines will be given to patient during program as part of exercise prescription that the participant will acknowledge.   Education: Aerobic Exercise: - Group verbal and visual presentation on the components of exercise prescription. Introduces F.I.T.T principle from ACSM for exercise prescriptions.  Reviews F.I.T.T. principles of aerobic exercise including progression. Written material given at graduation.   Education: Resistance Exercise: - Group verbal and visual presentation on the components of exercise prescription. Introduces F.I.T.T principle from ACSM for  exercise prescriptions  Reviews F.I.T.T. principles of resistance exercise including progression. Written material given at graduation.    Education: Exercise & Equipment Safety: - Individual verbal instruction and demonstration of equipment use and safety with use of the equipment. Flowsheet Row Cardiac Rehab from 02/02/2021 in [facility] Cardiac and Pulmonary Rehab  Date 02/02/21  Educator [facility]  Instruction Review Code 1- Verbalizes Understanding       Education: Exercise Physiology & General Exercise Guidelines: - Group verbal and written instruction with models to review the exercise physiology of the cardiovascular system and associated critical values. Provides general exercise guidelines with specific guidelines to those with heart or lung disease.    Education: Flexibility, Balance, Mind/Body Relaxation: - Group verbal and visual presentation with interactive activity on the components of exercise prescription. Introduces F.I.T.T principle from ACSM for exercise prescriptions. Reviews F.I.T.T. principles of flexibility and balance exercise training including progression. Also discusses the mind body connection.  Reviews various relaxation techniques to help reduce and manage stress (i.e. Deep breathing, progressive muscle relaxation, and visualization). Balance handout provided to take home. Written material given at graduation.   Activity Barriers & Risk Stratification:  Activity Barriers & Cardiac Risk Stratification - 02/02/21 1341       Activity Barriers & Cardiac Risk Stratification   Activity Barriers Left Knee Replacement;Right Knee Replacement;Joint Problems;Other (comment);Deconditioning;Muscular Weakness;Shortness of Breath;Balance Concerns;History of Falls;Assistive Device;Chest Pain/Angina    Comments peripheral neuropathy, pad, shuffles feet    Cardiac Risk Stratification High             6 Minute Walk:  6 Minute Walk     Row Name 02/02/21 1340         6 Minute  Walk   Phase Initial     Distance 598 feet     Walk Time 6 minutes     # of Rest Breaks 3  30 sec, 20 sec, 20 sec     MPH 1.41     METS 0.64     RPE 13     Perceived Dyspnea  2     VO2 Peak 2.26     Symptoms Yes (comment)     Comments SOB, chest pain 2/10     Resting HR 76 bpm     Resting BP 124/62     Resting Oxygen Saturation  97 %     Exercise Oxygen Saturation  during 6 min walk 96 %     Max Ex. HR 106 bpm     Max Ex. BP 152/74     2 Minute Post BP 128/64              Oxygen Initial Assessment:   Oxygen Re-Evaluation:   Oxygen Discharge (Final Oxygen Re-Evaluation):   Initial Exercise Prescription:  Initial Exercise Prescription - 02/02/21 1300  Date of Initial Exercise RX and Referring Provider   Date 02/02/21    Referring Provider Lorine Bears      Oxygen   Maintain Oxygen Saturation 88% or higher      Treadmill   MPH 1    Grade 0.5    Minutes 15    METs 1.8      NuStep   Level 1    SPM 80    Minutes 15    METs 1.5      REL-XR   Level 1    Speed 50    Minutes 15    METs 1.5      Track   Laps 17    Minutes 15    METs 1.92      Prescription Details   Frequency (times per week) 3    Duration Progress to 30 minutes of continuous aerobic without signs/symptoms of physical distress      Intensity   THRR 40-80% of Max Heartrate 103-130    Ratings of Perceived Exertion 11-13    Perceived Dyspnea 0-4      Progression   Progression Continue to progress workloads to maintain intensity without signs/symptoms of physical distress.      Resistance Training   Training Prescription Yes    Weight 3 lb    Reps 10-15             Perform Capillary Blood Glucose checks as needed.  Exercise Prescription Changes:   Exercise Prescription Changes     Row Name 02/02/21 1300             Response to Exercise   Blood Pressure (Admit) 124/62       Blood Pressure (Exercise) 152/74       Blood Pressure (Exit) 128/64       Heart  Rate (Admit) 76 bpm       Heart Rate (Exercise) 106 bpm       Heart Rate (Exit) 81 bpm       Oxygen Saturation (Admit) 97 %       Oxygen Saturation (Exercise) 96 %       Oxygen Saturation (Exit) 13 %       Rating of Perceived Exertion (Exercise) 2       Perceived Dyspnea (Exercise) 2       Symptoms SOB, chest pain 2/10       Comments walk test results                Exercise Comments:   Exercise Goals and Review:   Exercise Goals     Row Name 02/02/21 1350             Exercise Goals   Increase Physical Activity Yes       Intervention Provide advice, education, support and counseling about physical activity/exercise needs.;Develop an individualized exercise prescription for aerobic and resistive training based on initial evaluation findings, risk stratification, comorbidities and participant's personal goals.       Expected Outcomes Short Term: Attend rehab on a regular basis to increase amount of physical activity.;Long Term: Add in home exercise to make exercise part of routine and to increase amount of physical activity.;Long Term: Exercising regularly at least 3-5 days a week.       Increase Strength and Stamina Yes       Intervention Provide advice, education, support and counseling about physical activity/exercise needs.;Develop an individualized exercise prescription for aerobic and resistive training based on initial evaluation  findings, risk stratification, comorbidities and participant's personal goals.       Expected Outcomes Short Term: Increase workloads from initial exercise prescription for resistance, speed, and METs.;Short Term: Perform resistance training exercises routinely during rehab and add in resistance training at home;Long Term: Improve cardiorespiratory fitness, muscular endurance and strength as measured by increased METs and functional capacity ( )       Able to understand and use rate of perceived exertion (RPE) scale Yes       Intervention  Provide education and explanation on how to use RPE scale       Expected Outcomes Short Term: Able to use RPE daily in rehab to express subjective intensity level;Long Term:  Able to use RPE to guide intensity level when exercising independently       Able to understand and use Dyspnea scale Yes       Intervention Provide education and explanation on how to use Dyspnea scale       Expected Outcomes Long Term: Able to use Dyspnea scale to guide intensity level when exercising independently;Short Term: Able to use Dyspnea scale daily in rehab to express subjective sense of shortness of breath during exertion       Knowledge and understanding of Target Heart Rate Range (THRR) Yes       Intervention Provide education and explanation of THRR including how the numbers were predicted and where they are located for reference       Expected Outcomes Short Term: Able to state/look up THRR;Short Term: Able to use daily as guideline for intensity in rehab;Long Term: Able to use THRR to govern intensity when exercising independently       Able to check pulse independently Yes       Intervention Provide education and demonstration on how to check pulse in carotid and radial arteries.;Review the importance of being able to check your own pulse for safety during independent exercise       Expected Outcomes Short Term: Able to explain why pulse checking is important during independent exercise;Long Term: Able to check pulse independently and accurately       Understanding of Exercise Prescription Yes       Intervention Provide education, explanation, and written materials on patient's individual exercise prescription       Expected Outcomes Short Term: Able to explain program exercise prescription;Long Term: Able to explain home exercise prescription to exercise independently                Exercise Goals Re-Evaluation :   Discharge Exercise Prescription (Final Exercise Prescription Changes):  Exercise  Prescription Changes - 02/02/21 1300       Response to Exercise   Blood Pressure (Admit) 124/62    Blood Pressure (Exercise) 152/74    Blood Pressure (Exit) 128/64    Heart Rate (Admit) 76 bpm    Heart Rate (Exercise) 106 bpm    Heart Rate (Exit) 81 bpm    Oxygen Saturation (Admit) 97 %    Oxygen Saturation (Exercise) 96 %    Oxygen Saturation (Exit) 13 %    Rating of Perceived Exertion (Exercise) 2    Perceived Dyspnea (Exercise) 2    Symptoms SOB, chest pain 2/10    Comments walk test results             Nutrition:  Target Goals: Understanding of nutrition guidelines, daily intake of sodium 1500mg , cholesterol 200mg , calories 30% from fat and 7% or less from saturated fats, daily to have  5 or more servings of fruits and vegetables.  Education: All About Nutrition: -Group instruction provided by verbal, written material, interactive activities, discussions, models, and posters to present general guidelines for heart healthy nutrition including fat, fiber, MyPlate, the role of sodium in heart healthy nutrition, utilization of the nutrition label, and utilization of this knowledge for meal planning. Follow up email sent as well. Written material given at graduation. Flowsheet Row [facility]  Education need identified 02/02/21       Biometrics:  Pre Biometrics - 02/02/21 1350       Pre Biometrics   Height 5\' 4"  (1.626 m)    Weight 249 lb 14.4 oz (113.4 kg)    BMI (Calculated) 42.87    Single Leg Stand 1.2 seconds              Nutrition Therapy Plan and Nutrition Goals:  Nutrition Therapy & Goals - 02/02/21 1351       Intervention Plan   Intervention Prescribe, educate and counsel regarding individualized specific dietary modifications aiming towards targeted core components such as weight, hypertension, lipid management, diabetes, heart failure and other comorbidities.    Expected Outcomes Short Term Goal:  Understand basic principles of dietary content, such as calories, fat, sodium, cholesterol and nutrients.;Short Term Goal: A plan has been developed with personal nutrition goals set during dietitian appointment.;Long Term Goal: Adherence to prescribed nutrition plan.             Nutrition Assessments:  MEDIFICTS Score Key: ?70 Need to make dietary changes  40-70 Heart Healthy Diet ? 40 Therapeutic Level Cholesterol Diet  Flowsheet Row [facility]  Picture Your Plate Total Score on Admission 75      Picture Your Plate Scores: <16<40 Unhealthy dietary pattern with much room for improvement. 41-50 Dietary pattern unlikely to meet recommendations for good health and room for improvement. 51-60 More healthful dietary pattern, with some room for improvement.  >60 Healthy dietary pattern, although there may be some specific behaviors that could be improved.    Nutrition Goals Re-Evaluation:   Nutrition Goals Discharge (Final Nutrition Goals Re-Evaluation):   Psychosocial: Target Goals: Acknowledge presence or absence of significant depression and/or stress, maximize coping skills, provide positive support system. Participant is able to verbalize types and ability to use techniques and skills needed for reducing stress and depression.   Education: Stress, Anxiety, and Depression - Group verbal and visual presentation to define topics covered.  Reviews how body is impacted by stress, anxiety, and depression.  Also discusses healthy ways to reduce stress and to treat/manage anxiety and depression.  Written material given at graduation.   Education: Sleep Hygiene -Provides group verbal and written instruction about how sleep can affect your health.  Define sleep hygiene, discuss sleep cycles and impact of sleep habits. Review good sleep hygiene tips.    Initial Review & Psychosocial Screening:  Initial Psych Review & Screening -  01/19/21 1413       Initial Review   Current issues with Current Sleep Concerns;Current Stress Concerns    Source of Stress Concerns Chronic Illness      Family Dynamics   Good Support System? Yes   husband, children     Barriers   Psychosocial barriers to participate in program There are no identifiable barriers or psychosocial needs.;The patient should benefit from training in stress management and relaxation.      Screening  Interventions   Interventions Encouraged to exercise;To provide support and resources with identified psychosocial needs;Provide feedback about the scores to participant    Expected Outcomes Long Term Goal: Stressors or current issues are controlled or eliminated.;Short Term goal: Utilizing psychosocial counselor, staff and physician to assist with identification of specific Stressors or current issues interfering with healing process. Setting desired goal for each stressor or current issue identified.;Short Term goal: Identification and review with participant of any Quality of Life or Depression concerns found by scoring the questionnaire.;Long Term goal: The participant improves quality of Life and PHQ9 Scores as seen by post scores and/or verbalization of changes             Quality of Life Scores:   Quality of Life - 02/02/21 1350       Quality of Life   Select Quality of Life      Quality of Life Scores   Health/Function Pre 17.7 %    Socioeconomic Pre 27 %    Psych/Spiritual Pre 19.36 %    Family Pre 28.8 %    GLOBAL Pre 21.59 %            Scores of 19 and below usually indicate a poorer quality of life in these areas.  A difference of  2-3 points is a clinically meaningful difference.  A difference of 2-3 points in the total score of the Quality of Life Index has been associated with significant improvement in overall quality of life, self-image, physical symptoms, and general health in studies assessing change in quality of  life.  PHQ-9: Recent Review Flowsheet Data     Depression screen [facility] 2/9 02/02/2021   Decreased Interest 0   Down, Depressed, Hopeless 0   PHQ - 2 Score 0   Altered sleeping 1   Tired, decreased energy 2   Change in appetite 0   Feeling bad or failure about yourself  0   Trouble concentrating 0   Moving slowly or fidgety/restless 0   Suicidal thoughts 0   PHQ-9 Score 3   Difficult doing work/chores Not difficult at all      Interpretation of Total Score  Total Score Depression Severity:  1-4 = Minimal depression, 5-9 = Mild depression, 10-14 = Moderate depression, 15-19 = Moderately severe depression, 20-27 = Severe depression   Psychosocial Evaluation and Intervention:  Psychosocial Evaluation - 01/19/21 1420       Psychosocial Evaluation & Interventions   Interventions Encouraged to exercise with the program and follow exercise prescription    Comments Peniel states this hospital stay has stressed her out more than previous ones. She had afib for the first time but thankfully went back to [place]. She does have a lot going on- sold their house here that they spend part of the year where their son lives and moving permanently back to [place] where they spend the rest of the year with their daughter. They will miss Harvey but she wants to focus on her health. When she gets back to [place], she has appts with a vascular surgeon regarding her PAD and a fitting for her CPAP mask. She has a good support system and she tries to stay active. She does struggle with neuropathy and PAD so that makes exercising diffuclt at times. Even though she will only be in the program a few weeks, she is very motivated to learn as much as she can so she can maintain a heart healthy lifestyle    Expected Outcomes Short:  attend cardiac rehab for education and exercise. Long: develop and maintain positive self care habits.    Continue Psychosocial Services  Follow up required by staff             Psychosocial  Re-Evaluation:   Psychosocial Discharge (Final Psychosocial Re-Evaluation):   Vocational Rehabilitation: Provide vocational rehab assistance to qualifying candidates.   Vocational Rehab Evaluation & Intervention:  Vocational Rehab - 01/19/21 1413       Initial Vocational Rehab Evaluation & Intervention   Assessment shows need for Vocational Rehabilitation No             Education: Education Goals: Education classes will be provided on a variety of topics geared toward better understanding of heart health and risk factor modification. Participant will state understanding/return demonstration of topics presented as noted by education test scores.  Learning Barriers/Preferences:  Learning Barriers/Preferences - 01/19/21 1413       Learning Barriers/Preferences   Learning Barriers None    Learning Preferences None             General Cardiac Education Topics:  AED/CPR: - Group verbal and written instruction with the use of models to demonstrate the basic use of the AED with the basic ABC's of resuscitation.   Anatomy and Cardiac Procedures: - Group verbal and visual presentation and models provide information about basic cardiac anatomy and function. Reviews the testing methods done to diagnose heart disease and the outcomes of the test results. Describes the treatment choices: Medical Management, Angioplasty, or Coronary Bypass Surgery for treating various heart conditions including Myocardial Infarction, Angina, Valve Disease, and Cardiac Arrhythmias.  Written material given at graduation. Flowsheet Row Cardiac Rehab from 02/02/2021 in [facility] Cardiac and Pulmonary Rehab  Education need identified 02/02/21       Medication Safety: - Group verbal and visual instruction to review commonly prescribed medications for heart and lung disease. Reviews the medication, class of the drug, and side effects. Includes the steps to properly store meds and maintain the prescription  regimen.  Written material given at graduation.   Intimacy: - Group verbal instruction through game format to discuss how heart and lung disease can affect sexual intimacy. Written material given at graduation..   Know Your Numbers and Heart Failure: - Group verbal and visual instruction to discuss disease risk factors for cardiac and pulmonary disease and treatment options.  Reviews associated critical values for Overweight/Obesity, Hypertension, Cholesterol, and Diabetes.  Discusses basics of heart failure: signs/symptoms and treatments.  Introduces Heart Failure Zone chart for action plan for heart failure.  Written material given at graduation. Flowsheet Row Cardiac Rehab from 02/02/2021 in [facility] Cardiac and Pulmonary Rehab  Education need identified 02/02/21       Infection Prevention: - Provides verbal and written material to individual with discussion of infection control including proper hand washing and proper equipment cleaning during exercise session. Flowsheet Row Cardiac Rehab from 02/02/2021 in [facility] Cardiac and Pulmonary Rehab  Date 02/02/21  Educator [facility]  Instruction Review Code 1- Verbalizes Understanding       Falls Prevention: - Provides verbal and written material to individual with discussion of falls prevention and safety. Flowsheet Row Cardiac Rehab from 02/02/2021 in [facility] Cardiac and Pulmonary Rehab  Date 02/02/21  Educator [facility]  Instruction Review Code 1- Verbalizes Understanding       Other: -Provides group and verbal instruction on various topics (see comments)   Knowledge Questionnaire Score:  Knowledge Questionnaire Score - 02/02/21 1352  Knowledge Questionnaire Score   Pre Score 23/26             Core Components/Risk Factors/Patient Goals at Admission:  Personal Goals and Risk Factors at Admission - 02/02/21 1352       Core Components/Risk Factors/Patient Goals on Admission    Weight Management Yes;Obesity;Weight Loss     Intervention Weight Management: Develop a combined nutrition and exercise program designed to reach desired caloric intake, while maintaining appropriate intake of nutrient and fiber, sodium and fats, and appropriate energy expenditure required for the weight goal.;Weight Management: Provide education and appropriate resources to help participant work on and attain dietary goals.;Weight Management/Obesity: Establish reasonable short term and long term weight goals.;Obesity: Provide education and appropriate resources to help participant work on and attain dietary goals.    Admit Weight 249 lb 14.4 oz (113.4 kg)    Goal Weight: Short Term 245 lb (111.1 kg)    Goal Weight: Long Term 240 lb (108.9 kg)    Expected Outcomes Short Term: Continue to assess and modify interventions until short term weight is achieved;Long Term: Adherence to nutrition and physical activity/exercise program aimed toward attainment of established weight goal;Weight Loss: Understanding of general recommendations for a balanced deficit meal plan, which promotes 1-2 lb weight loss per week and includes a negative energy balance of [phone] kcal/d;Understanding recommendations for meals to include 15-35% energy as protein, 25-35% energy from fat, 35-60% energy from carbohydrates, less than 200mg  of dietary cholesterol, 20-35 gm of total fiber daily;Understanding of distribution of calorie intake throughout the day with the consumption of 4-5 meals/snacks    Diabetes Yes    Intervention Provide education about signs/symptoms and action to take for hypo/hyperglycemia.;Provide education about proper nutrition, including hydration, and aerobic/resistive exercise prescription along with prescribed medications to achieve blood glucose in normal ranges: Fasting glucose 65-99 mg/dL    Expected Outcomes Short Term: Participant verbalizes understanding of the signs/symptoms and immediate care of hyper/hypoglycemia, proper foot care and importance of  medication, aerobic/resistive exercise and nutrition plan for blood glucose control.;Long Term: Attainment of HbA1C < 7%.    Hypertension Yes    Intervention Provide education on lifestyle modifcations including regular physical activity/exercise, weight management, moderate sodium restriction and increased consumption of fresh fruit, vegetables, and low fat dairy, alcohol moderation, and smoking cessation.;Monitor prescription use compliance.    Expected Outcomes Short Term: Continued assessment and intervention until BP is < 140/21mm HG in hypertensive participants. < 130/70mm HG in hypertensive participants with diabetes, heart failure or chronic kidney disease.;Long Term: Maintenance of blood pressure at goal levels.    Lipids Yes    Intervention Provide education and support for participant on nutrition & aerobic/resistive exercise along with prescribed medications to achieve LDL 70mg , HDL >40mg .    Expected Outcomes Short Term: Participant states understanding of desired cholesterol values and is compliant with medications prescribed. Participant is following exercise prescription and nutrition guidelines.;Long Term: Cholesterol controlled with medications as prescribed, with individualized exercise RX and with personalized nutrition plan. Value goals: LDL < 70mg , HDL > 40 mg.             Education:Diabetes - Individual verbal and written instruction to review signs/symptoms of diabetes, desired ranges of glucose level fasting, after meals and with exercise. Acknowledge that pre and post exercise glucose checks will be done for 3 sessions at entry of program. Flowsheet Row Cardiac Rehab from 02/02/2021 in [facility] Cardiac and Pulmonary Rehab  Date 01/19/21  Educator [facility]  Instruction Review Code  1- Verbalizes Understanding       Core Components/Risk Factors/Patient Goals Review:    Core Components/Risk Factors/Patient Goals at Discharge (Final Review):    ITP Comments:  ITP Comments      Row Name 01/19/21 1431 02/02/21 1340         ITP Comments Initial telephone orientation completed. Diagnosis can be found in [facility] 8/8. EP orientation scheduled for Monday 9/12 at 9am. Completed and gym orientation. Initial ITP created and sent for review to Dr. Bethann Punches, Medical Director.               Comments: Initial ITP

## 2021-02-04 ENCOUNTER — Other Ambulatory Visit: Payer: Self-pay

## 2021-02-04 ENCOUNTER — Telehealth: Payer: Self-pay | Admitting: Medical

## 2021-02-04 ENCOUNTER — Other Ambulatory Visit (INDEPENDENT_AMBULATORY_CARE_PROVIDER_SITE_OTHER): Payer: Medicare Other | Admitting: *Deleted

## 2021-02-04 DIAGNOSIS — I5032 Chronic diastolic (congestive) heart failure: Secondary | ICD-10-CM

## 2021-02-04 DIAGNOSIS — I480 Paroxysmal atrial fibrillation: Secondary | ICD-10-CM

## 2021-02-04 DIAGNOSIS — I214 Non-ST elevation (NSTEMI) myocardial infarction: Secondary | ICD-10-CM | POA: Diagnosis not present

## 2021-02-04 DIAGNOSIS — Z79899 Other long term (current) drug therapy: Secondary | ICD-10-CM

## 2021-02-04 LAB — GLUCOSE, CAPILLARY
Glucose-Capillary: 151 mg/dL — ABNORMAL HIGH (ref 70–99)
Glucose-Capillary: 123 mg/dL — ABNORMAL HIGH (ref 70–99)

## 2021-02-04 NOTE — Progress Notes (Signed)
Daily Session Note  Patient Details  Name: Hannah Thomas MRN: [id number] Date of Birth: 01-10-1946 Referring Provider:   Flowsheet Row Cardiac Rehab from 02/02/2021 in [facility] Cardiac and Pulmonary Rehab  Referring Provider Kathlyn Sacramento       Encounter Date: 02/04/2021  Check In:  Session Check In - 02/04/21 1112       Check-In   Supervising physician immediately available to respond to emergencies See telemetry face sheet for immediately available ER MD    Location [facility]-Cardiac & Pulmonary Rehab    Staff Present Birdie Sons, MPA, Elveria Rising, BA, ACSM CEP, Exercise Physiologist;Jessica Baraboo, MA, RCEP, CCRP, CCET;Joseph [place], [place]    Virtual Visit No    Medication changes reported     No    Fall or balance concerns reported    No    Warm-up and Cool-down Performed on first and last piece of equipment    Resistance Training Performed Yes    VAD Patient? No    PAD/SET Patient? No      Pain Assessment   Currently in Pain? No/denies                Social History   Tobacco Use  Smoking Status Former   Packs/day: 0.00   Types: Cigarettes   Start date: 1965   Quit date: 1977   Years since quitting: 45.7  Smokeless Tobacco Never    Goals Met:  Independence with exercise equipment Exercise tolerated well No report of concerns or symptoms today Strength training completed today  Goals Unmet:  Not Applicable  Comments: First full day of exercise!  Patient was oriented to gym and equipment including functions, settings, policies, and procedures.  Patient's individual exercise prescription and treatment plan were reviewed.  All starting workloads were established based on the results of the 6 minute walk test done at initial orientation visit.  The plan for exercise progression was also introduced and progression will be customized based on patient's performance and goals.     Dr. Emily Filbert is Medical Director for [place].   Dr. Ottie Glazier is Medical Director for [facility] Pulmonary Rehabilitation.

## 2021-02-04 NOTE — Telephone Encounter (Signed)
  Patient Consent for Virtual Visit        Hannah Thomas has provided verbal consent on 02/04/2021 for a virtual visit (video or telephone).   CONSENT FOR VIRTUAL VISIT FOR:  Hannah Thomas  By participating in this virtual visit I agree to the following:  I hereby voluntarily request, consent and authorize [facility] and its employed or contracted physicians, physician assistants, nurse practitioners or other licensed health care professionals (the Practitioner), to provide me with telemedicine health care services (the "Services") as deemed necessary by the treating Practitioner. I acknowledge and consent to receive the Services by the Practitioner via telemedicine. I understand that the telemedicine visit will involve communicating with the Practitioner through live audiovisual communication technology and the disclosure of certain medical information by electronic transmission. I acknowledge that I have been given the opportunity to request an in-person assessment or other available alternative prior to the telemedicine visit and am voluntarily participating in the telemedicine visit.  I understand that I have the right to withhold or withdraw my consent to the use of telemedicine in the course of my care at any time, without affecting my right to future care or treatment, and that the Practitioner or I may terminate the telemedicine visit at any time. I understand that I have the right to inspect all information obtained and/or recorded in the course of the telemedicine visit and may receive copies of available information for a reasonable fee.  I understand that some of the potential risks of receiving the Services via telemedicine include:  Delay or interruption in medical evaluation due to technological equipment failure or disruption; Information transmitted may not be sufficient (e.g. poor resolution of images) to allow for appropriate medical decision making by the Practitioner; and/or  In rare  instances, security protocols could fail, causing a breach of personal health information.  Furthermore, I acknowledge that it is my responsibility to provide information about my medical history, conditions and care that is complete and accurate to the best of my ability. I acknowledge that Practitioner's advice, recommendations, and/or decision may be based on factors not within their control, such as incomplete or inaccurate data provided by me or distortions of diagnostic images or specimens that may result from electronic transmissions. I understand that the practice of medicine is not an exact science and that Practitioner makes no warranties or guarantees regarding treatment outcomes. I acknowledge that a copy of this consent can be made available to me via my patient portal [facility] MyChart), or I can request a printed copy by calling the office of [facility].    I understand that my insurance will be billed for this visit.   I have read or had this consent read to me. I understand the contents of this consent, which adequately explains the benefits and risks of the Services being provided via telemedicine.  I have been provided ample opportunity to ask questions regarding this consent and the Services and have had my questions answered to my satisfaction. I give my informed consent for the services to be provided through the use of telemedicine in my medical care

## 2021-02-05 LAB — BASIC METABOLIC PANEL
BUN/Creatinine Ratio: 24 (ref 12–28)
BUN: 40 mg/dL — ABNORMAL HIGH (ref 8–27)
CO2: 21 mmol/L (ref 20–29)
Calcium: 9.5 mg/dL (ref 8.7–10.3)
Chloride: 103 mmol/L (ref 96–106)
Creatinine, Ser: 1.69 mg/dL — ABNORMAL HIGH (ref 0.57–1.00)
Glucose: 140 mg/dL — ABNORMAL HIGH (ref 65–99)
Potassium: 4.6 mmol/L (ref 3.5–5.2)
Sodium: 140 mmol/L (ref 134–144)
eGFR: 31 mL/min/{1.73_m2} — ABNORMAL LOW (ref >59)

## 2021-02-06 ENCOUNTER — Other Ambulatory Visit: Payer: Self-pay

## 2021-02-06 ENCOUNTER — Encounter: Payer: Medicare Other | Admitting: *Deleted

## 2021-02-06 DIAGNOSIS — I214 Non-ST elevation (NSTEMI) myocardial infarction: Secondary | ICD-10-CM | POA: Diagnosis not present

## 2021-02-06 LAB — GLUCOSE, CAPILLARY
Glucose-Capillary: 135 mg/dL — ABNORMAL HIGH (ref 70–99)
Glucose-Capillary: 112 mg/dL — ABNORMAL HIGH (ref 70–99)

## 2021-02-06 NOTE — Progress Notes (Signed)
Daily Session Note  Patient Details  Name: Hannah Thomas MRN: [id number] Date of Birth: 1945/12/30 Referring Provider:   Flowsheet Row Cardiac Rehab from 02/02/2021 in [facility] Cardiac and Pulmonary Rehab  Referring Provider Kathlyn Sacramento       Encounter Date: 02/06/2021  Check In:  Session Check In - 02/06/21 1119       Check-In   Supervising physician immediately available to respond to emergencies See telemetry face sheet for immediately available ER MD    Location [facility]-Cardiac & Pulmonary Rehab    Staff Present Renita Papa, RN BSN;Jessica Patterson, MA, RCEP, CCRP, CCET;Joseph [place], [place]    Virtual Visit No    Medication changes reported     No    Fall or balance concerns reported    No    Warm-up and Cool-down Performed on first and last piece of equipment    Resistance Training Performed Yes    VAD Patient? No    PAD/SET Patient? No      Pain Assessment   Currently in Pain? No/denies                Social History   Tobacco Use  Smoking Status Former   Packs/day: 0.00   Types: Cigarettes   Start date: 1965   Quit date: 1977   Years since quitting: 45.7  Smokeless Tobacco Never    Goals Met:  Independence with exercise equipment Exercise tolerated well No report of concerns or symptoms today Strength training completed today  Goals Unmet:  Not Applicable  Comments: Pt able to follow exercise prescription today without complaint.  Will continue to monitor for progression.    Dr. Emily Filbert is Medical Director for [place].  Dr. Ottie Glazier is Medical Director for [facility] Pulmonary Rehabilitation.

## 2021-02-09 ENCOUNTER — Encounter: Payer: Medicare Other | Admitting: *Deleted

## 2021-02-09 ENCOUNTER — Other Ambulatory Visit: Payer: Self-pay

## 2021-02-09 ENCOUNTER — Ambulatory Visit: Payer: Medicare Other | Admitting: Medical

## 2021-02-09 DIAGNOSIS — I214 Non-ST elevation (NSTEMI) myocardial infarction: Secondary | ICD-10-CM | POA: Diagnosis not present

## 2021-02-09 LAB — GLUCOSE, CAPILLARY
Glucose-Capillary: 157 mg/dL — ABNORMAL HIGH (ref 70–99)
Glucose-Capillary: 119 mg/dL — ABNORMAL HIGH (ref 70–99)

## 2021-02-09 NOTE — Progress Notes (Signed)
Daily Session Note  Patient Details  Name: Hannah Thomas MRN: [id number] Date of Birth: [birth year] Referring Provider:   Flowsheet Row Cardiac Rehab from 02/02/2021 in [facility] Cardiac and Pulmonary Rehab  Referring Provider Kathlyn Sacramento       Encounter Date: 02/09/2021  Check In:  Session Check In - 02/09/21 1113       Check-In   Supervising physician immediately available to respond to emergencies See telemetry face sheet for immediately available ER MD    Location [facility]-Cardiac & Pulmonary Rehab    Staff Present Renita Papa, RN Moises Blood, BS, ACSM CEP, Exercise Physiologist;Jessica [place], MA, RCEP, CCRP, CCET;Amanda Sommer, [place], ACSM CEP, Exercise Physiologist    Virtual Visit No    Medication changes reported     No    Fall or balance concerns reported    No    Warm-up and Cool-down Performed on first and last piece of equipment    Resistance Training Performed Yes    VAD Patient? No    PAD/SET Patient? No      Pain Assessment   Currently in Pain? No/denies                Social History   Tobacco Use  Smoking Status Former   Packs/day: 0.00   Types: Cigarettes   Start date: 1965   Quit date: 1977   Years since quitting: 45.7  Smokeless Tobacco Never    Goals Met:  Independence with exercise equipment Exercise tolerated well No report of concerns or symptoms today Strength training completed today  Goals Unmet:  Not Applicable  Comments: Pt able to follow exercise prescription today without complaint.  Will continue to monitor for progression.    Dr. Emily Filbert is Medical Director for [place].  Dr. Ottie Glazier is Medical Director for [facility] Pulmonary Rehabilitation.

## 2021-02-11 DIAGNOSIS — I480 Paroxysmal atrial fibrillation: Secondary | ICD-10-CM | POA: Diagnosis not present

## 2021-02-12 ENCOUNTER — Telehealth: Payer: Self-pay | Admitting: Cardiovascular Disease

## 2021-02-12 NOTE — Telephone Encounter (Signed)
Pt notified of recc below.  Pt voiced understanding and will contact her dermatologist to see if they will prescribe an alternative antifungal.

## 2021-02-12 NOTE — Telephone Encounter (Signed)
Spoke with pt. Notified of lab results per result note: Labs stable.  Continue current medications and f/u with Cadence Fransico Michael, PA, as scheduled.  Pt will follow up 10/6 with Cadence Fransico Michael, PA for virtual visit.  Pt has no further questions re lab results.   Pt proceeded to report that she recently was seen by dermatology for a rash on her back and was prescribed Fluconazole 200 mg for two weeks.  After reading drug information re risk for organ damage and to take precaution if >77 yrs old, pt would like to verify with cardiology that ok to take with current medications and cardiac hx.  Notified pt I will forward to PharmD to advise on this and will call her back.

## 2021-02-12 NOTE — Telephone Encounter (Signed)
Patient calling to check the status of lab results  

## 2021-02-12 NOTE — Telephone Encounter (Signed)
Fluconazole can increase the risk of bleeding with Eliquis and can decrease the effectiveness of clopidogrel.  Due to her extensive cardiac history, recommend she contact her dermatologist to see if there is another antifungal that can be used

## 2021-02-13 ENCOUNTER — Other Ambulatory Visit: Payer: Self-pay

## 2021-02-13 DIAGNOSIS — I214 Non-ST elevation (NSTEMI) myocardial infarction: Secondary | ICD-10-CM | POA: Diagnosis not present

## 2021-02-13 NOTE — Progress Notes (Signed)
Daily Session Note  Patient Details  Name: Hannah Thomas MRN: [id number] Date of Birth: 1945-06-01 Referring Provider:   Flowsheet Row Cardiac Rehab from 02/02/2021 in [facility] Cardiac and Pulmonary Rehab  Referring Provider Kathlyn Sacramento       Encounter Date: 02/13/2021  Check In:  Session Check In - 02/13/21 1121       Check-In   Supervising physician immediately available to respond to emergencies See telemetry face sheet for immediately available ER MD    Location [facility]-Cardiac & Pulmonary Rehab    Staff Present Hope Budds, RDN, LDN;Jessica Luan Pulling, MA, RCEP, CCRP, CCET;[place] Luppino, RN, BSN    Virtual Visit No    Medication changes reported     No    Fall or balance concerns reported    No    Warm-up and Cool-down Performed on first and last piece of equipment    Resistance Training Performed Yes    VAD Patient? No    PAD/SET Patient? No      Pain Assessment   Currently in Pain? No/denies                Social History   Tobacco Use  Smoking Status Former   Packs/day: 0.00   Types: Cigarettes   Start date: 1965   Quit date: 1977   Years since quitting: 45.7  Smokeless Tobacco Never    Goals Met:  Independence with exercise equipment Exercise tolerated well No report of concerns or symptoms today Strength training completed today  Goals Unmet:  Not Applicable  Comments: Pt able to follow exercise prescription today without complaint.  Will continue to monitor for progression.   Dr. Emily Filbert is Medical Director for [place].  Dr. Ottie Glazier is Medical Director for [facility] Pulmonary Rehabilitation.

## 2021-02-16 ENCOUNTER — Other Ambulatory Visit: Payer: Self-pay

## 2021-02-16 ENCOUNTER — Encounter: Payer: Medicare Other | Admitting: *Deleted

## 2021-02-16 DIAGNOSIS — I214 Non-ST elevation (NSTEMI) myocardial infarction: Secondary | ICD-10-CM

## 2021-02-16 NOTE — Progress Notes (Signed)
Daily Session Note  Patient Details  Name: Hannah Thomas MRN: [id number] Date of Birth: 03/30/1946 Referring Provider:   Flowsheet Row Cardiac Rehab from 02/02/2021 in [facility] Cardiac and Pulmonary Rehab  Referring Provider Arida, Muhammad       Encounter Date: 02/16/2021  Check In:  Session Check In - 02/16/21 1138       Check-In   Supervising physician immediately available to respond to emergencies See telemetry face sheet for immediately available ER MD    Location [facility]-Cardiac & Pulmonary Rehab    Staff Present Mary Jo Abernethy, RN, BSN, MA;Jessica Hawkins, MA, RCEP, CCRP, CCET;Kelly Bollinger, MPA, RN;Kelly Hayes, BS, ACSM CEP, Exercise Physiologist    Virtual Visit No    Medication changes reported     No    Fall or balance concerns reported    No    Tobacco Cessation No Change    Warm-up and Cool-down Performed on first and last piece of equipment    Resistance Training Performed Yes    VAD Patient? No    PAD/SET Patient? No      Pain Assessment   Currently in Pain? No/denies                Social History   Tobacco Use  Smoking Status Former   Packs/day: 0.00   Types: Cigarettes   Start date: 1965   Quit date: 1977   Years since quitting: 45.7  Smokeless Tobacco Never    Goals Met:  Independence with exercise equipment Exercise tolerated well No report of concerns or symptoms today  Goals Unmet:  Not Applicable  Comments: Pt able to follow exercise prescription today without complaint.  Will continue to monitor for progression.    Dr. Mark Miller is Medical Director for [facility].  Dr. Fuad Aleskerov is Medical Director for [facility] Pulmonary Rehabilitation. 

## 2021-02-16 NOTE — Progress Notes (Signed)
Daily Session Note  Patient Details  Name: Hannah Thomas MRN: [id number] Date of Birth: 07-05-1945 Referring Provider:   Flowsheet Row Cardiac Rehab from 02/02/2021 in [facility] Cardiac and Pulmonary Rehab  Referring Provider Kathlyn Sacramento       Encounter Date: 02/16/2021  Check In:  Session Check In - 02/16/21 1147       Check-In   Supervising physician immediately available to respond to emergencies See telemetry face sheet for immediately available ER MD    Location [facility]-Cardiac & Pulmonary Rehab    Staff Present Nyoka Cowden, RN, BSN, Jennye Moccasin, MPA, RN;Kelly Amedeo Plenty, BS, ACSM CEP, Exercise Physiologist;Jessica Benton, MA, RCEP, CCRP, CCET    Virtual Visit No    Medication changes reported     No    Fall or balance concerns reported    No    Tobacco Cessation No Change    Warm-up and Cool-down Performed on first and last piece of equipment    Resistance Training Performed Yes    VAD Patient? No    PAD/SET Patient? No      Pain Assessment   Currently in Pain? No/denies                Social History   Tobacco Use  Smoking Status Former   Packs/day: 0.00   Types: Cigarettes   Start date: 1965   Quit date: 1977   Years since quitting: 45.7  Smokeless Tobacco Never    Goals Met:  Independence with exercise equipment Exercise tolerated well No report of concerns or symptoms today  Goals Unmet:  Not Applicable  Comments: Pt able to follow exercise prescription today without complaint.  Will continue to monitor for progression.    Dr. Emily Filbert is Medical Director for [place].  Dr. Ottie Glazier is Medical Director for [facility] Pulmonary Rehabilitation.

## 2021-02-20 ENCOUNTER — Encounter: Payer: Medicare Other | Admitting: *Deleted

## 2021-02-20 ENCOUNTER — Other Ambulatory Visit: Payer: Self-pay

## 2021-02-20 DIAGNOSIS — I214 Non-ST elevation (NSTEMI) myocardial infarction: Secondary | ICD-10-CM | POA: Diagnosis not present

## 2021-02-20 NOTE — Progress Notes (Signed)
Daily Session Note  Patient Details  Name: Hannah Thomas MRN: [id number] Date of Birth: October 30, 1945 Referring Provider:   Flowsheet Row Cardiac Rehab from 02/02/2021 in [facility] Cardiac and Pulmonary Rehab  Referring Provider Kathlyn Sacramento       Encounter Date: 02/20/2021  Check In:  Session Check In - 02/20/21 1108       Check-In   Supervising physician immediately available to respond to emergencies See telemetry face sheet for immediately available ER MD    Location [facility]-Cardiac & Pulmonary Rehab    Staff Present Renita Papa, RN BSN;Joseph [place], RCP,RRT,BSRT;Jessica [place], [place], RCEP, CCRP, CCET    Virtual Visit No    Medication changes reported     No    Fall or balance concerns reported    No    Warm-up and Cool-down Performed on first and last piece of equipment    Resistance Training Performed Yes    VAD Patient? No    PAD/SET Patient? No      Pain Assessment   Currently in Pain? No/denies                Social History   Tobacco Use  Smoking Status Former   Packs/day: 0.00   Types: Cigarettes   Start date: 1965   Quit date: 1977   Years since quitting: 45.7  Smokeless Tobacco Never    Goals Met:  Independence with exercise equipment Exercise tolerated well No report of concerns or symptoms today Strength training completed today  Goals Unmet:  Not Applicable  Comments: Pt able to follow exercise prescription today without complaint.  Will continue to monitor for progression.    Dr. Emily Filbert is Medical Director for [place].  Dr. Ottie Glazier is Medical Director for [facility] Pulmonary Rehabilitation.

## 2021-02-23 ENCOUNTER — Other Ambulatory Visit: Payer: Self-pay

## 2021-02-23 ENCOUNTER — Encounter: Payer: Medicare Other | Attending: Cardiovascular Disease

## 2021-02-23 DIAGNOSIS — I214 Non-ST elevation (NSTEMI) myocardial infarction: Secondary | ICD-10-CM | POA: Insufficient documentation

## 2021-02-23 NOTE — Progress Notes (Signed)
Daily Session Note  Patient Details  Name: Hannah Thomas MRN: [id number] Date of Birth: 1945-10-17 Referring Provider:   Flowsheet Row Cardiac Rehab from 02/02/2021 in [facility] Cardiac and Pulmonary Rehab  Referring Provider Kathlyn Sacramento       Encounter Date: 02/23/2021  Check In:  Session Check In - 02/23/21 1117       Check-In   Supervising physician immediately available to respond to emergencies See telemetry face sheet for immediately available ER MD    Location [facility]-Cardiac & Pulmonary Rehab    Staff Present Birdie Sons, MPA, RN;Jessica [place], MA, RCEP, CCRP, CCET;Amanda Sommer, BA, ACSM CEP, Exercise Physiologist;Vraj Denardo Lago, BS, ACSM CEP, Exercise Physiologist    Virtual Visit No    Medication changes reported     No    Fall or balance concerns reported    No    Tobacco Cessation No Change    Warm-up and Cool-down Performed on first and last piece of equipment    Resistance Training Performed Yes    VAD Patient? No    PAD/SET Patient? No      Pain Assessment   Currently in Pain? No/denies                Social History   Tobacco Use  Smoking Status Former   Packs/day: 0.00   Types: Cigarettes   Start date: 1965   Quit date: 1977   Years since quitting: 45.7  Smokeless Tobacco Never    Goals Met:  Patient was not feeling her best and decided not to exercise today. She is moving to [place] tomorrow so will not be returning. She provided her transfer medical information to continue rehab in [place]. She was advised to see her doctor should she continue to feel bad. She stated understanding and had no further questions at this time.   Goals Unmet:  Not Applicable  Comments:  Hannah Thomas graduated today from  rehab with 9 sessions completed.  Details of the patient's exercise prescription and what She needs to do in order to continue the prescription and progress were discussed with patient.  Patient was given a copy of prescription and goals.  Patient verbalized  understanding.  Hannah Thomas plans to continue to exercise by continuing with cardiac rehab in [place], [place].    Dr. Emily Filbert is Medical Director for [place].  Dr. Ottie Glazier is Medical Director for [facility] Pulmonary Rehabilitation.

## 2021-02-23 NOTE — Progress Notes (Signed)
Discharge Progress Report  Patient Details  Name: Hannah Thomas MRN: [id number] Date of Birth: 07/19/1945 Referring Provider:   Flowsheet Row Cardiac Rehab from 02/02/2021 in [facility]  Referring Provider Hannah Thomas        Number of Visits: 9  Reason for Discharge:  Early Exit:  Patient is moving to [place] and plans to continue cardiac rehab at the hospital in [place], [place].   Smoking History:  Social History   Tobacco Use  Smoking Status Former   Packs/day: 0.00   Types: Cigarettes   Start date: 1965   Quit date: 1977   Years since quitting: 45.7  Smokeless Tobacco Never    Diagnosis:  NSTEMI (non-ST elevated myocardial infarction) (HCC)  ADL [facility]:   Initial Exercise Prescription:  Initial Exercise Prescription - 02/02/21 1300       Date of Initial Exercise RX and Referring Provider   Date 02/02/21    Referring Provider Hannah Thomas      Oxygen   Maintain Oxygen Saturation 88% or higher      Treadmill   MPH 1    Grade 0.5    Minutes 15    METs 1.8      NuStep   Level 1    SPM 80    Minutes 15    METs 1.5      REL-XR   Level 1    Speed 50    Minutes 15    METs 1.5      Track   Laps 17    Minutes 15    METs 1.92      Prescription Details   Frequency (times per week) 3    Duration Progress to 30 minutes of continuous aerobic without signs/symptoms of physical distress      Intensity   THRR 40-80% of Max Heartrate 103-130    Ratings of Perceived Exertion 11-13    Perceived Dyspnea 0-4      Progression   Progression Continue to progress workloads to maintain intensity without signs/symptoms of physical distress.      Resistance Training   Training Prescription Yes    Weight 3 lb    Reps 10-15             Discharge Exercise Prescription (Final Exercise Prescription Changes):  Exercise Prescription Changes - 02/09/21 1600       Response to Exercise   Blood Pressure (Admit) 146/70    Blood  Pressure (Exercise) 146/68    Blood Pressure (Exit) 130/62    Heart Rate (Admit) 83 bpm    Heart Rate (Exercise) 102 bpm    Heart Rate (Exit) 81 bpm    Oxygen Saturation (Exit) 13 %    Comments third day exercise    Duration Progress to 30 minutes of  aerobic without signs/symptoms of physical distress    Intensity THRR unchanged      Progression   Progression Continue to progress workloads to maintain intensity without signs/symptoms of physical distress.    Average METs 1.8      Resistance Training   Training Prescription Yes    Weight 3 lb    Reps 10-15      Treadmill   MPH 1    Grade 0.5    METs 1.8      REL-XR   Level 1    Minutes 15             Functional Capacity:  6 Minute Walk  Row Name 02/02/21 1340         6 Minute Walk   Phase Initial     Distance 598 feet     Walk Time 6 minutes     # of Rest Breaks 3  30 sec, 20 sec, 20 sec     MPH 1.41     METS 0.64     RPE 13     Perceived Dyspnea  2     VO2 Peak 2.26     Symptoms Yes (comment)     Comments SOB, chest pain 2/10     Resting HR 76 bpm     Resting BP 124/62     Resting Oxygen Saturation  97 %     Exercise Oxygen Saturation  during 6 min walk 96 %     Max Ex. HR 106 bpm     Max Ex. BP 152/74     2 Minute Post BP 128/64              Psychological, QOL, Others - Outcomes: PHQ 2/9: Depression screen PHQ 2/9 02/02/2021  Decreased Interest 0  Down, Depressed, Hopeless 0  PHQ - 2 Score 0  Altered sleeping 1  Tired, decreased energy 2  Change in appetite 0  Feeling bad or failure about yourself  0  Trouble concentrating 0  Moving slowly or fidgety/restless 0  Suicidal thoughts 0  PHQ-9 Score 3  Difficult doing work/chores Not difficult at all    Quality of Life:  Quality of Life - 02/02/21 1350       Quality of Life   Select Quality of Life      Quality of Life Scores   Health/Function Pre 17.7 %    Socioeconomic Pre 27 %    Psych/Spiritual Pre 19.36 %    Family Pre  28.8 %    GLOBAL Pre 21.59 %                Nutrition & Weight - Outcomes:  Pre Biometrics - 02/02/21 1350       Pre Biometrics   Height 5\' 4"  (1.626 m)    Weight 249 lb 14.4 oz (113.4 kg)    BMI (Calculated) 42.87    Single Leg Stand 1.2 seconds              Nutrition:  Nutrition Therapy & Goals - 02/16/21 1129       Personal Nutrition Goals   Nutrition Goal No goals: Hannah Thomas is only in the program for 3 weeks. She is moving 10/5.    Comments Provided education for diabetes, heart health, and CKD stg 3. Answered questions.             Nutrition Discharge:   Education Questionnaire Score:  Knowledge Questionnaire Score - 02/02/21 1352       Knowledge Questionnaire Score   Pre Score 23/26             Goals reviewed with patient; copy given to patient.

## 2021-02-23 NOTE — Progress Notes (Signed)
Cardiac Individual Treatment Plan  Patient Details  Name: Hannah Thomas MRN: [id number] Date of Birth: Dec 02, 1945 Referring Provider:   Flowsheet Row Cardiac Rehab from 02/02/2021 in [facility] Cardiac and Pulmonary Rehab  Referring Provider Lorine Bears       Initial Encounter Date:  Flowsheet Row Cardiac Rehab from 02/02/2021 in [facility] Cardiac and Pulmonary Rehab  Date 02/02/21       Visit Diagnosis: NSTEMI (non-ST elevated myocardial infarction) [facility])  Patient's Home Medications on Admission:  Current Outpatient Medications:    acidophilus (RISAQUAD) CAPS capsule, Take 1 capsule by mouth daily., Disp: , Rfl:    amLODipine (NORVASC) 10 MG tablet, Take 1 tablet (10 mg total) by mouth daily., Disp: 90 tablet, Rfl: 2   apixaban (ELIQUIS) 5 MG TABS tablet, Take 1 tablet (5 mg total) by mouth 2 (two) times daily., Disp: 180 tablet, Rfl: 1   clopidogrel (PLAVIX) 75 MG tablet, Take 75 mg by mouth daily., Disp: , Rfl:    furosemide (LASIX) 20 MG tablet, Take 2 tablets (40 mg total) by mouth daily., Disp: 90 tablet, Rfl: 3   insulin glargine (LANTUS) 100 UNIT/ML Solostar Pen, Inject 60 Units into the skin at bedtime., Disp: , Rfl:    linagliptin (TRADJENTA) 5 MG TABS tablet, Take 5 mg by mouth daily., Disp: , Rfl:    losartan (COZAAR) 50 MG tablet, Take 1 tablet (50 mg total) by mouth daily., Disp: 90 tablet, Rfl: 2   metFORMIN (GLUCOPHAGE) 1000 MG tablet, Take 1,000 mg by mouth 2 (two) times daily with a meal. (Patient not taking: Reported on 01/28/2021), Disp: , Rfl:    metoprolol succinate (TOPROL-XL) 100 MG 24 hr tablet, Take 0.5 tablets (50 mg total) by mouth at bedtime., Disp: , Rfl:    Multiple Vitamins-Minerals (OCUVITE PRESERVISION PO), Take by mouth 2 (two) times daily., Disp: , Rfl:    nitroGLYCERIN (NITRODUR - DOSED IN MG/24 HR) 0.4 mg/hr patch, Place 1 patch (0.4 mg total) onto the skin daily., Disp: 90 patch, Rfl: 1   nitroGLYCERIN (NITROSTAT) 0.4 MG SL tablet, Place 1 tablet (0.4 mg  total) under the tongue every 5 (five) minutes as needed for chest pain., Disp: 100 tablet, Rfl: 3  Past Medical History: Past Medical History:  Diagnosis Date   Anemia    CAD in native artery    a. MI ~ 2000 s/p PCI x 2; b. PCI ~ 2015; c. PCI ~ 2020 complicated vy contrast-induced nephropathy; d. PCI 07/2020   CKD (chronic kidney disease)    DM2 (diabetes mellitus, type 2) (HCC)    Essential hypertension    Falls    Hyperlipidemia LDL goal <70    Obesity    Peripheral neuropathy     Tobacco Use: Social History   Tobacco Use  Smoking Status Former   Packs/day: 0.00   Types: Cigarettes   Start date: 1965   Quit date: 1977   Years since quitting: 45.7  Smokeless Tobacco Never    Labs: Recent Review Geneticist, molecular for ITP Cardiac and Pulmonary Rehab Latest Ref Rng & Units 12/26/2020 12/27/2020   Cholestrol 0 - 200 mg/dL - 409(W)   LDLCALC 0 - 99 mg/dL - 119(J)   HDL >47 mg/dL - 82(N)   Trlycerides <562 mg/dL - 130(Q)   Hemoglobin M5H 4.8 - 5.6 % 6.7(H) -        Exercise Target Goals: Exercise Program Goal: Individual exercise prescription set using results from initial 6 min walk  test and THRR while considering  patient's activity barriers and safety.   Exercise Prescription Goal: Initial exercise prescription builds to 30-45 minutes a day of aerobic activity, 2-3 days per week.  Home exercise guidelines will be given to patient during program as part of exercise prescription that the participant will acknowledge.   Education: Aerobic Exercise: - Group verbal and visual presentation on the components of exercise prescription. Introduces F.I.T.T principle from ACSM for exercise prescriptions.  Reviews F.I.T.T. principles of aerobic exercise including progression. Written material given at graduation.   Education: Resistance Exercise: - Group verbal and visual presentation on the components of exercise prescription. Introduces F.I.T.T principle from ACSM for  exercise prescriptions  Reviews F.I.T.T. principles of resistance exercise including progression. Written material given at graduation.    Education: Exercise & Equipment Safety: - Individual verbal instruction and demonstration of equipment use and safety with use of the equipment. Flowsheet Row Cardiac Rehab from 02/04/2021 in [facility] Cardiac and Pulmonary Rehab  Date 02/02/21  Educator [employer]  Instruction Review Code 1- Verbalizes Understanding       Education: Exercise Physiology & General Exercise Guidelines: - Group verbal and written instruction with models to review the exercise physiology of the cardiovascular system and associated critical values. Provides general exercise guidelines with specific guidelines to those with heart or lung disease.    Education: Flexibility, Balance, Mind/Body Relaxation: - Group verbal and visual presentation with interactive activity on the components of exercise prescription. Introduces F.I.T.T principle from ACSM for exercise prescriptions. Reviews F.I.T.T. principles of flexibility and balance exercise training including progression. Also discusses the mind body connection.  Reviews various relaxation techniques to help reduce and manage stress (i.e. Deep breathing, progressive muscle relaxation, and visualization). Balance handout provided to take home. Written material given at graduation.   Activity Barriers & Risk Stratification:  Activity Barriers & Cardiac Risk Stratification - 02/02/21 1341       Activity Barriers & Cardiac Risk Stratification   Activity Barriers Left Knee Replacement;Right Knee Replacement;Joint Problems;Other (comment);Deconditioning;Muscular Weakness;Shortness of Breath;Balance Concerns;History of Falls;Assistive Device;Chest Pain/Angina    Comments peripheral neuropathy, pad, shuffles feet    Cardiac Risk Stratification High             6 Minute Walk:  6 Minute Walk     Row Name 02/02/21 1340         6 Minute  Walk   Phase Initial     Distance 598 feet     Walk Time 6 minutes     # of Rest Breaks 3  30 sec, 20 sec, 20 sec     MPH 1.41     METS 0.64     RPE 13     Perceived Dyspnea  2     VO2 Peak 2.26     Symptoms Yes (comment)     Comments SOB, chest pain 2/10     Resting HR 76 bpm     Resting BP 124/62     Resting Oxygen Saturation  97 %     Exercise Oxygen Saturation  during 6 min walk 96 %     Max Ex. HR 106 bpm     Max Ex. BP 152/74     2 Minute Post BP 128/64              Oxygen Initial Assessment:   Oxygen Re-Evaluation:   Oxygen Discharge (Final Oxygen Re-Evaluation):   Initial Exercise Prescription:  Initial Exercise Prescription - 02/02/21 1300  Date of Initial Exercise RX and Referring Provider   Date 02/02/21    Referring Provider Lorine Bears      Oxygen   Maintain Oxygen Saturation 88% or higher      Treadmill   MPH 1    Grade 0.5    Minutes 15    METs 1.8      NuStep   Level 1    SPM 80    Minutes 15    METs 1.5      REL-XR   Level 1    Speed 50    Minutes 15    METs 1.5      Track   Laps 17    Minutes 15    METs 1.92      Prescription Details   Frequency (times per week) 3    Duration Progress to 30 minutes of continuous aerobic without signs/symptoms of physical distress      Intensity   THRR 40-80% of Max Heartrate 103-130    Ratings of Perceived Exertion 11-13    Perceived Dyspnea 0-4      Progression   Progression Continue to progress workloads to maintain intensity without signs/symptoms of physical distress.      Resistance Training   Training Prescription Yes    Weight 3 lb    Reps 10-15             Perform Capillary Blood Glucose checks as needed.  Exercise Prescription Changes:   Exercise Prescription Changes     Row Name 02/02/21 1300 02/09/21 1600           Response to Exercise   Blood Pressure (Admit) 124/62 146/70      Blood Pressure (Exercise) 152/74 146/68      Blood Pressure  (Exit) 128/64 130/62      Heart Rate (Admit) 76 bpm 83 bpm      Heart Rate (Exercise) 106 bpm 102 bpm      Heart Rate (Exit) 81 bpm 81 bpm      Oxygen Saturation (Admit) 97 % --      Oxygen Saturation (Exercise) 96 % --      Oxygen Saturation (Exit) 13 % 13 %      Rating of Perceived Exertion (Exercise) 2 --      Perceived Dyspnea (Exercise) 2 --      Symptoms SOB, chest pain 2/10 --      Comments walk test results third day exercise      Duration -- Progress to 30 minutes of  aerobic without signs/symptoms of physical distress      Intensity -- THRR unchanged             Progression   Progression -- Continue to progress workloads to maintain intensity without signs/symptoms of physical distress.      Average METs -- 1.8             Resistance Training   Training Prescription -- Yes      Weight -- 3 lb      Reps -- 10-15             Treadmill   MPH -- 1      Grade -- 0.5      METs -- 1.8             REL-XR   Level -- 1      Minutes -- 15  Exercise Comments:   Exercise Comments     Row Name 02/04/21 1113 02/23/21 1121         Exercise Comments First full day of exercise!  Patient was oriented to gym and equipment including functions, settings, policies, and procedures.  Patient's individual exercise prescription and treatment plan were reviewed.  All starting workloads were established based on the results of the 6 minute walk test done at initial orientation visit.  The plan for exercise progression was also introduced and progression will be customized based on patient's performance and goals. Rhyleigh graduated today from  rehab with 9 sessions completed.  Details of the patient's exercise prescription and what She needs to do in order to continue the prescription and progress were discussed with patient.  Patient was given a copy of prescription and goals.  Patient verbalized understanding.  Stefhanie plans to continue to exercise by continuing with cardiac  rehab in [place], [place].               Exercise Goals and Review:   Exercise Goals     Row Name 02/02/21 1350             Exercise Goals   Increase Physical Activity Yes       Intervention Provide advice, education, support and counseling about physical activity/exercise needs.;Develop an individualized exercise prescription for aerobic and resistive training based on initial evaluation findings, risk stratification, comorbidities and participant's personal goals.       Expected Outcomes Short Term: Attend rehab on a regular basis to increase amount of physical activity.;Long Term: Add in home exercise to make exercise part of routine and to increase amount of physical activity.;Long Term: Exercising regularly at least 3-5 days a week.       Increase Strength and Stamina Yes       Intervention Provide advice, education, support and counseling about physical activity/exercise needs.;Develop an individualized exercise prescription for aerobic and resistive training based on initial evaluation findings, risk stratification, comorbidities and participant's personal goals.       Expected Outcomes Short Term: Increase workloads from initial exercise prescription for resistance, speed, and METs.;Short Term: Perform resistance training exercises routinely during rehab and add in resistance training at home;Long Term: Improve cardiorespiratory fitness, muscular endurance and strength as measured by increased METs and functional capacity ( )       Able to understand and use rate of perceived exertion (RPE) scale Yes       Intervention Provide education and explanation on how to use RPE scale       Expected Outcomes Short Term: Able to use RPE daily in rehab to express subjective intensity level;Long Term:  Able to use RPE to guide intensity level when exercising independently       Able to understand and use Dyspnea scale Yes       Intervention Provide education and explanation on how to use  Dyspnea scale       Expected Outcomes Long Term: Able to use Dyspnea scale to guide intensity level when exercising independently;Short Term: Able to use Dyspnea scale daily in rehab to express subjective sense of shortness of breath during exertion       Knowledge and understanding of Target Heart Rate Range (THRR) Yes       Intervention Provide education and explanation of THRR including how the numbers were predicted and where they are located for reference       Expected Outcomes Short Term: Able to state/look up THRR;Short  Term: Able to use daily as guideline for intensity in rehab;Long Term: Able to use THRR to govern intensity when exercising independently       Able to check pulse independently Yes       Intervention Provide education and demonstration on how to check pulse in carotid and radial arteries.;Review the importance of being able to check your own pulse for safety during independent exercise       Expected Outcomes Short Term: Able to explain why pulse checking is important during independent exercise;Long Term: Able to check pulse independently and accurately       Understanding of Exercise Prescription Yes       Intervention Provide education, explanation, and written materials on patient's individual exercise prescription       Expected Outcomes Short Term: Able to explain program exercise prescription;Long Term: Able to explain home exercise prescription to exercise independently                Exercise Goals Re-Evaluation :  Exercise Goals Re-Evaluation     Row Name 02/04/21 1113 02/09/21 1701           Exercise Goal Re-Evaluation   Exercise Goals Review Increase Physical Activity;Able to understand and use rate of perceived exertion (RPE) scale;Knowledge and understanding of Target Heart Rate Range (THRR);Understanding of Exercise Prescription;Increase Strength and Stamina;Able to understand and use Dyspnea scale;Able to check pulse independently Increase Physical  Activity;Increase Strength and Stamina      Comments Reviewed RPE and dyspnea scales, THR and program prescription with pt today.  Pt voiced understanding and was given a copy of goals to take home. Zuleica is doing well in her first sessions.  She has reached THR range most times.  Staff will monitor progress.      Expected Outcomes Short: Use RPE daily to regulate intensity. Long: Follow program prescription in THR. Short:  attend consistently Long:build overall stamina               Discharge Exercise Prescription (Final Exercise Prescription Changes):  Exercise Prescription Changes - 02/09/21 1600       Response to Exercise   Blood Pressure (Admit) 146/70    Blood Pressure (Exercise) 146/68    Blood Pressure (Exit) 130/62    Heart Rate (Admit) 83 bpm    Heart Rate (Exercise) 102 bpm    Heart Rate (Exit) 81 bpm    Oxygen Saturation (Exit) 13 %    Comments third day exercise    Duration Progress to 30 minutes of  aerobic without signs/symptoms of physical distress    Intensity THRR unchanged      Progression   Progression Continue to progress workloads to maintain intensity without signs/symptoms of physical distress.    Average METs 1.8      Resistance Training   Training Prescription Yes    Weight 3 lb    Reps 10-15      Treadmill   MPH 1    Grade 0.5    METs 1.8      REL-XR   Level 1    Minutes 15             Nutrition:  Target Goals: Understanding of nutrition guidelines, daily intake of sodium 1500mg , cholesterol 200mg , calories 30% from fat and 7% or less from saturated fats, daily to have 5 or more servings of fruits and vegetables.  Education: All About Nutrition: -Group instruction provided by verbal, written material, interactive activities, discussions, models, and posters to  present general guidelines for heart healthy nutrition including fat, fiber, MyPlate, the role of sodium in heart healthy nutrition, utilization of the nutrition label, and  utilization of this knowledge for meal planning. Follow up email sent as well. Written material given at graduation. Flowsheet Row Cardiac Rehab from 02/04/2021 in [facility] Cardiac and Pulmonary Rehab  Education need identified 02/02/21       Biometrics:  Pre Biometrics - 02/02/21 1350       Pre Biometrics   Height 5\' 4"  (1.626 m)    Weight 249 lb 14.4 oz (113.4 kg)    BMI (Calculated) 42.87    Single Leg Stand 1.2 seconds              Nutrition Therapy Plan and Nutrition Goals:  Nutrition Therapy & Goals - 02/16/21 1129       Personal Nutrition Goals   Nutrition Goal No goals: Pepper is only in the program for 3 weeks. She is moving 10/5.    Comments Provided education for diabetes, heart health, and CKD stg 3. Answered questions.             Nutrition Assessments:  MEDIFICTS Score Key: ?70 Need to make dietary changes  40-70 Heart Healthy Diet ? 40 Therapeutic Level Cholesterol Diet  Flowsheet Row Cardiac Rehab from 02/02/2021 in [facility] Cardiac and Pulmonary Rehab  Picture Your Plate Total Score on Admission 75      Picture Your Plate Scores: [facility] Unhealthy dietary pattern with much room for improvement. 41-50 Dietary pattern unlikely to meet recommendations for good health and room for improvement. 51-60 More healthful dietary pattern, with some room for improvement.  >60 Healthy dietary pattern, although there may be some specific behaviors that could be improved.    Nutrition Goals Re-Evaluation:   Nutrition Goals Discharge (Final Nutrition Goals Re-Evaluation):   Psychosocial: Target Goals: Acknowledge presence or absence of significant depression and/or stress, maximize coping skills, provide positive support system. Participant is able to verbalize types and ability to use techniques and skills needed for reducing stress and depression.   Education: Stress, Anxiety, and Depression - Group verbal and visual presentation to define topics covered.  Reviews  how body is impacted by stress, anxiety, and depression.  Also discusses healthy ways to reduce stress and to treat/manage anxiety and depression.  Written material given at graduation.   Education: Sleep Hygiene -Provides group verbal and written instruction about how sleep can affect your health.  Define sleep hygiene, discuss sleep cycles and impact of sleep habits. Review good sleep hygiene tips.    Initial Review & Psychosocial Screening:  Initial Psych Review & Screening - 01/19/21 1413       Initial Review   Current issues with Current Sleep Concerns;Current Stress Concerns    Source of Stress Concerns Chronic Illness      Family Dynamics   Good Support System? Yes   husband, children     Barriers   Psychosocial barriers to participate in program There are no identifiable barriers or psychosocial needs.;The patient should benefit from training in stress management and relaxation.      Screening Interventions   Interventions Encouraged to exercise;To provide support and resources with identified psychosocial needs;Provide feedback about the scores to participant    Expected Outcomes Long Term Goal: Stressors or current issues are controlled or eliminated.;Short Term goal: Utilizing psychosocial counselor, staff and physician to assist with identification of specific Stressors or current issues interfering with healing process. Setting desired goal for each  stressor or current issue identified.;Short Term goal: Identification and review with participant of any Quality of Life or Depression concerns found by scoring the questionnaire.;Long Term goal: The participant improves quality of Life and PHQ9 Scores as seen by post scores and/or verbalization of changes             Quality of Life Scores:   Quality of Life - 02/02/21 1350       Quality of Life   Select Quality of Life      Quality of Life Scores   Health/Function Pre 17.7 %    Socioeconomic Pre 27 %     Psych/Spiritual Pre 19.36 %    Family Pre 28.8 %    GLOBAL Pre 21.59 %            Scores of 19 and below usually indicate a poorer quality of life in these areas.  A difference of  2-3 points is a clinically meaningful difference.  A difference of 2-3 points in the total score of the Quality of Life Index has been associated with significant improvement in overall quality of life, self-image, physical symptoms, and general health in studies assessing change in quality of life.  PHQ-9: Recent Review Flowsheet Data     Depression screen [facility] 2/9 02/02/2021   Decreased Interest 0   Down, Depressed, Hopeless 0   PHQ - 2 Score 0   Altered sleeping 1   Tired, decreased energy 2   Change in appetite 0   Feeling bad or failure about yourself  0   Trouble concentrating 0   Moving slowly or fidgety/restless 0   Suicidal thoughts 0   PHQ-9 Score 3   Difficult doing work/chores Not difficult at all      Interpretation of Total Score  Total Score Depression Severity:  1-4 = Minimal depression, 5-9 = Mild depression, 10-14 = Moderate depression, 15-19 = Moderately severe depression, 20-27 = Severe depression   Psychosocial Evaluation and Intervention:  Psychosocial Evaluation - 01/19/21 1420       Psychosocial Evaluation & Interventions   Interventions Encouraged to exercise with the program and follow exercise prescription    Comments Neviah states this hospital stay has stressed her out more than previous ones. She had afib for the first time but thankfully went back to [place]. She does have a lot going on- sold their house here that they spend part of the year where their son lives and moving permanently back to [place] where they spend the rest of the year with their daughter. They will miss [place] but she wants to focus on her health. When she gets back to [place], she has appts with a vascular surgeon regarding her PAD and a fitting for her CPAP mask. She has a good support system and she tries to  stay active. She does struggle with neuropathy and PAD so that makes exercising diffuclt at times. Even though she will only be in the program a few weeks, she is very motivated to learn as much as she can so she can maintain a heart healthy lifestyle    Expected Outcomes Short: attend cardiac rehab for education and exercise. Long: develop and maintain positive self care habits.    Continue Psychosocial Services  Follow up required by staff             Psychosocial Re-Evaluation:   Psychosocial Discharge (Final Psychosocial Re-Evaluation):   Vocational Rehabilitation: Provide vocational rehab assistance to qualifying candidates.   Vocational Rehab  Evaluation & Intervention:  Vocational Rehab - 01/19/21 1413       Initial Vocational Rehab Evaluation & Intervention   Assessment shows need for Vocational Rehabilitation No             Education: Education Goals: Education classes will be provided on a variety of topics geared toward better understanding of heart health and risk factor modification. Participant will state understanding/return demonstration of topics presented as noted by education test scores.  Learning Barriers/Preferences:  Learning Barriers/Preferences - 01/19/21 1413       Learning Barriers/Preferences   Learning Barriers None    Learning Preferences None             General Cardiac Education Topics:  AED/CPR: - Group verbal and written instruction with the use of models to demonstrate the basic use of the AED with the basic ABC's of resuscitation.   Anatomy and Cardiac Procedures: - Group verbal and visual presentation and models provide information about basic cardiac anatomy and function. Reviews the testing methods done to diagnose heart disease and the outcomes of the test results. Describes the treatment choices: Medical Management, Angioplasty, or Coronary Bypass Surgery for treating various heart conditions including Myocardial  Infarction, Angina, Valve Disease, and Cardiac Arrhythmias.  Written material given at graduation. Flowsheet Row Cardiac Rehab from 02/04/2021 in [facility] Cardiac and Pulmonary Rehab  Education need identified 02/02/21       Medication Safety: - Group verbal and visual instruction to review commonly prescribed medications for heart and lung disease. Reviews the medication, class of the drug, and side effects. Includes the steps to properly store meds and maintain the prescription regimen.  Written material given at graduation. Flowsheet Row Cardiac Rehab from 02/04/2021 in [facility] Cardiac and Pulmonary Rehab  Date 02/04/21  Educator SB  Instruction Review Code 1- Verbalizes Understanding       Intimacy: - Group verbal instruction through game format to discuss how heart and lung disease can affect sexual intimacy. Written material given at graduation..   Know Your Numbers and Heart Failure: - Group verbal and visual instruction to discuss disease risk factors for cardiac and pulmonary disease and treatment options.  Reviews associated critical values for Overweight/Obesity, Hypertension, Cholesterol, and Diabetes.  Discusses basics of heart failure: signs/symptoms and treatments.  Introduces Heart Failure Zone chart for action plan for heart failure.  Written material given at graduation. Flowsheet Row Cardiac Rehab from 02/04/2021 in [facility] Cardiac and Pulmonary Rehab  Education need identified 02/02/21       Infection Prevention: - Provides verbal and written material to individual with discussion of infection control including proper hand washing and proper equipment cleaning during exercise session. Flowsheet Row Cardiac Rehab from 02/04/2021 in [facility] Cardiac and Pulmonary Rehab  Date 02/02/21  Educator [facility]  Instruction Review Code 1- Verbalizes Understanding       Falls Prevention: - Provides verbal and written material to individual with discussion of falls prevention and  safety. Flowsheet Row Cardiac Rehab from 02/04/2021 in [facility] Cardiac and Pulmonary Rehab  Date 02/02/21  Educator [facility]  Instruction Review Code 1- Verbalizes Understanding       Other: -Provides group and verbal instruction on various topics (see comments)   Knowledge Questionnaire Score:  Knowledge Questionnaire Score - 02/02/21 1352       Knowledge Questionnaire Score   Pre Score 23/26             Core Components/Risk Factors/Patient Goals at Admission:  Personal Goals and Risk Factors  at Admission - 02/02/21 1352       Core Components/Risk Factors/Patient Goals on Admission    Weight Management Yes;Obesity;Weight Loss    Intervention Weight Management: Develop a combined nutrition and exercise program designed to reach desired caloric intake, while maintaining appropriate intake of nutrient and fiber, sodium and fats, and appropriate energy expenditure required for the weight goal.;Weight Management: Provide education and appropriate resources to help participant work on and attain dietary goals.;Weight Management/Obesity: Establish reasonable short term and long term weight goals.;Obesity: Provide education and appropriate resources to help participant work on and attain dietary goals.    Admit Weight 249 lb 14.4 oz (113.4 kg)    Goal Weight: Short Term 245 lb (111.1 kg)    Goal Weight: Long Term 240 lb (108.9 kg)    Expected Outcomes Short Term: Continue to assess and modify interventions until short term weight is achieved;Long Term: Adherence to nutrition and physical activity/exercise program aimed toward attainment of established weight goal;Weight Loss: Understanding of general recommendations for a balanced deficit meal plan, which promotes 1-2 lb weight loss per week and includes a negative energy balance of [phone] kcal/d;Understanding recommendations for meals to include 15-35% energy as protein, 25-35% energy from fat, 35-60% energy from carbohydrates, less than 200mg   of dietary cholesterol, 20-35 gm of total fiber daily;Understanding of distribution of calorie intake throughout the day with the consumption of 4-5 meals/snacks    Diabetes Yes    Intervention Provide education about signs/symptoms and action to take for hypo/hyperglycemia.;Provide education about proper nutrition, including hydration, and aerobic/resistive exercise prescription along with prescribed medications to achieve blood glucose in normal ranges: Fasting glucose 65-99 mg/dL    Expected Outcomes Short Term: Participant verbalizes understanding of the signs/symptoms and immediate care of hyper/hypoglycemia, proper foot care and importance of medication, aerobic/resistive exercise and nutrition plan for blood glucose control.;Long Term: Attainment of HbA1C < 7%.    Hypertension Yes    Intervention Provide education on lifestyle modifcations including regular physical activity/exercise, weight management, moderate sodium restriction and increased consumption of fresh fruit, vegetables, and low fat dairy, alcohol moderation, and smoking cessation.;Monitor prescription use compliance.    Expected Outcomes Short Term: Continued assessment and intervention until BP is < 140/75mm HG in hypertensive participants. < 130/32mm HG in hypertensive participants with diabetes, heart failure or chronic kidney disease.;Long Term: Maintenance of blood pressure at goal levels.    Lipids Yes    Intervention Provide education and support for participant on nutrition & aerobic/resistive exercise along with prescribed medications to achieve LDL 70mg , HDL >40mg .    Expected Outcomes Short Term: Participant states understanding of desired cholesterol values and is compliant with medications prescribed. Participant is following exercise prescription and nutrition guidelines.;Long Term: Cholesterol controlled with medications as prescribed, with individualized exercise RX and with personalized nutrition plan. Value goals: LDL  < 70mg , HDL > 40 mg.             Education:Diabetes - Individual verbal and written instruction to review signs/symptoms of diabetes, desired ranges of glucose level fasting, after meals and with exercise. Acknowledge that pre and post exercise glucose checks will be done for 3 sessions at entry of program. Flowsheet Row Cardiac Rehab from 02/04/2021 in [facility] Cardiac and Pulmonary Rehab  Date 01/19/21  Educator [facility]  Instruction Review Code 1- Verbalizes Understanding       Core Components/Risk Factors/Patient Goals Review:    Core Components/Risk Factors/Patient Goals at Discharge (Final Review):    ITP Comments:  ITP  Comments     Row Name 01/19/21 1431 02/02/21 1340 02/04/21 1113 02/16/21 1135 02/23/21 1120   ITP Comments Initial telephone orientation completed. Diagnosis can be found in [facility] 8/8. EP orientation scheduled for Monday 9/12 at 9am. Completed and gym orientation. Initial ITP created and sent for review to Dr. Bethann Punches, Medical Director. First full day of exercise!  Patient was oriented to gym and equipment including functions, settings, policies, and procedures.  Patient's individual exercise prescription and treatment plan were reviewed.  All starting workloads were established based on the results of the 6 minute walk test done at initial orientation visit.  The plan for exercise progression was also introduced and progression will be customized based on patient's performance and goals. Completed nutrition education Ludie graduated today from  rehab with 9 sessions completed.  Details of the patient's exercise prescription and what She needs to do in order to continue the prescription and progress were discussed with patient.  Patient was given a copy of prescription and goals.  Patient verbalized understanding.  Blessings plans to continue to exercise by continuing with cardiac rehab in [place], [place].            Comments: discharge ITP

## 2021-02-25 NOTE — Progress Notes (Signed)
Virtual Visit via Telephone Note   This visit type was conducted due to national recommendations for restrictions regarding the COVID-19 Pandemic (e.g. social distancing) in an effort to limit this patient's exposure and mitigate transmission in our community.  Due to her co-morbid illnesses, this patient is at least at moderate risk for complications without adequate follow up.  This format is felt to be most appropriate for this patient at this time.  The patient did not have access to video technology/had technical difficulties with video requiring transitioning to audio format only (telephone).  All issues noted in this document were discussed and addressed.  No physical exam could be performed with this format.  Please refer to the patient's chart for her  consent to telehealth for [facility].    Date:  02/26/2021   ID:  Hannah Thomas, DOB Apr 21, 1946, MRN [id number] The patient was identified using 2 identifiers.  Patient Location: Home Provider Location: Office/Clinic   PCP:  Pcp, No   [facility] Providers Cardiologist: Dr. Okey Dupre  Evaluation Performed:  Follow-Up Visit  Chief Complaint:  Follow-up  History of Present Illness:    Hannah Thomas is a 75 y.o. female with a hx of CAD s/p multiple PCIs, afib, DM2, peripheral neropathy, HTN, HLD with statin intolerance, CKD stage 3, PAD, morbid obesity, falls, suspecte sleep apnea who presents for follow-up.    She was hospitalized in early August with chest pain in the setting of atrial fibrillation with rapid ventricular response.  Catheterization showed chronic total occlusion of the LCx and otherwise nonobstructive disease.  This was managed medically.  She was seen for follow-up by Dutchess Crosland, PA, on 01/07/2021, at which time she complained of fluid retention after discontinuation of her diuretics.  She was noted to be in sinus rhythm with marked hypertension (blood pressure 194/64).  She was restarted on furosemide 20 mg daily and  transition from metoprolol succinate to carvedilol 12.5 mg twice daily.  Due to to her creatinine being elevated a little bit above baseline, she was advised to decrease furosemide to 10 mg daily.  She was also switched to Repatha for improved lipid control in the setting of her statin intolerance.  She presented to the [facility] emergency department on 01/12/2021 complaining of weakness and chest pain.  She was noted to be in sinus rhythm at that time without acute EKG changes.  High-sensitivity troponin and I was negative.  Incidental note was made of gingivitis and possible periapical abscess prompting referral to dentistry and initiation of amoxicillin.  She reached out to our office yesterday complaining of shortness of breath and irregular heartbeat concerning for recurrent atrial fibrillation.  Last seen 01/28/21 and reported palpitations and chest pain. NTG patch was increased and a heart monitor was ordered  Heart monitor 01/2021 showed predominately SR with avg HR 77bpm, rare PACs/PVCs, 1 episode of NSVT lasting 5 beats, 35 atrial runs lasting up to 16 seconds, patient triggered events with PACs and PVCs.  Today, the patient is doing well. Increase in NTG has helped the chest pain. Reports still has some chest pain and tightness. Has taken NTG 2 weeks ago, took it three times a week. Patient denies palpitations. Discussed heart monitor results. Discussed increasing Toprol but says she has fatigue on higher doses. She feels her chest pain is stress related, currently going through a lot regarding houses. No LLE, or pnd. She has chronic orthopnea.   The patient does not have symptoms concerning for COVID-19 infection (fever, chills,  cough, or new shortness of breath).    Past Medical History:  Diagnosis Date   Anemia    CAD in native artery    a. MI ~ 2000 s/p PCI x 2; b. PCI ~ 2015; c. PCI ~ 2020 complicated vy contrast-induced nephropathy; d. PCI 07/2020   CKD (chronic kidney disease)    DM2  (diabetes mellitus, type 2) (HCC)    Essential hypertension    Falls    Hyperlipidemia LDL goal <70    Obesity    Peripheral neuropathy    Past Surgical History:  Procedure Laterality Date   CARDIAC CATHETERIZATION     LEFT HEART CATH AND CORONARY ANGIOGRAPHY N/A 12/29/2020   Procedure: LEFT HEART CATH AND CORONARY ANGIOGRAPHY;  Surgeon: Iran Ouch, MD;  Location: [facility] INVASIVE CV LAB;  Service: Cardiovascular;  Laterality: N/A;     Current Meds  Medication Sig   acidophilus (RISAQUAD) CAPS capsule Take 1 capsule by mouth daily.   amLODipine (NORVASC) 10 MG tablet Take 1 tablet (10 mg total) by mouth daily.   apixaban (ELIQUIS) 5 MG TABS tablet Take 1 tablet (5 mg total) by mouth 2 (two) times daily.   clopidogrel (PLAVIX) 75 MG tablet Take 75 mg by mouth daily.   furosemide (LASIX) 20 MG tablet Take 2 tablets (40 mg total) by mouth daily.   insulin glargine (LANTUS) 100 UNIT/ML Solostar Pen Inject 60 Units into the skin at bedtime.   linagliptin (TRADJENTA) 5 MG TABS tablet Take 5 mg by mouth daily.   losartan (COZAAR) 50 MG tablet Take 1 tablet (50 mg total) by mouth daily.   metFORMIN (GLUCOPHAGE) 500 MG tablet Take 500 mg by mouth 2 (two) times daily with a meal.   metoprolol succinate (TOPROL-XL) 100 MG 24 hr tablet Take 0.5 tablets (50 mg total) by mouth at bedtime.   Multiple Vitamins-Minerals (OCUVITE PRESERVISION PO) Take by mouth 2 (two) times daily.   nitroGLYCERIN (NITRODUR - DOSED IN MG/24 HR) 0.4 mg/hr patch Place 1 patch (0.4 mg total) onto the skin daily.   nitroGLYCERIN (NITROSTAT) 0.4 MG SL tablet Place 1 tablet (0.4 mg total) under the tongue every 5 (five) minutes as needed for chest pain.     Allergies:   Erythromycin, Isosorbide, Ezetimibe, and Morphine and related   Social History   Tobacco Use   Smoking status: Former    Packs/day: 0.00    Types: Cigarettes    Start date: 1965    Quit date: 1977    Years since quitting: 45.7   Smokeless tobacco:  Never  Substance Use Topics   Alcohol use: Not Currently   Drug use: Never     Family Hx: The patient's family history includes Breast cancer in her mother; CAD in her mother; Dementia in her father; Endometrial cancer in her sister; Hypertension in her mother; Migraines in her mother; Parkinson's disease in her father; Stroke in her mother.  ROS:   Please see the history of present illness.     All other systems reviewed and are negative.   Prior CV studies:   The following studies were reviewed today:  Event monitor 01/2021 The patient was monitored for 14 days. The predominant rhythm was sinus with an average rate of 77 bpm (range 55-114 bpm and sinus). There were rare PACs and PVCs. One episode of nonsustained ventricular tachycardia lasting 5 beats was observed, with a maximum rate of 187 bpm. There were 35 atrial runs lasting up to 16 seconds with  a maximum rate of 169 bpm. No sustained arrhythmia or prolonged pause occurred. Patient triggered events correspond to sinus rhythm, PACs, and PVCs.   Predominantly sinus rhythm with rare PACs and PVCs.  Single brief episode of NSVT occurred as well as several runs of PSVT lasting up to 16 seconds.  LHC 12/2020   Prox RCA to Mid RCA lesion is 10% stenosed.   Dist RCA lesion is 20% stenosed.   RPAV lesion is 10% stenosed.   Ost LAD to Prox LAD lesion is 20% stenosed.   Dist LM lesion is 30% stenosed.   Prox Cx to Mid Cx lesion is 100% stenosed.   Ost Cx to Prox Cx lesion is 95% stenosed with 95% stenosed side branch in 1st Mrg.   Previously placed Mid LAD stent (unknown type) is  widely patent.   1.  Significant underlying three-vessel coronary artery disease with patent stents in the LAD and right coronary artery with minimal restenosis.  Chronically occluded left circumflex with right to left and left to left collaterals 2.  Left ventricular angiography was not performed.  EF was normal by echo. 3.  Mildly elevated left  ventricular end-diastolic pressure.   Recommendations: Suspect that the patient had elevated troponin due to supply demand ischemia related to chronically occluded left circumflex in the setting of A. fib with RVR.  No revascularization is indicated. Continue medical therapy. Start anticoagulation with Eliquis 5 mg twice daily tomorrow to be used with clopidogrel 75 mg once daily.  Discontinue aspirin. I increase losartan to 100 mg daily for blood pressure control. The patient can likely be discharged home later today.   Echo 12/2020 1. Left ventricular ejection fraction, by estimation, is 55 to 60%. The  left ventricle has normal function. The left ventricle demonstrates  regional wall motion abnormalities (see scoring diagram/findings for  description). There is mild left ventricular   hypertrophy. Left ventricular diastolic parameters were normal. There is  mild hypokinesis of the left ventricular, basal-mid inferior wall.   2. Right ventricular systolic function is normal. The right ventricular  size is normal. Tricuspid regurgitation signal is inadequate for assessing  PA pressure.   3. The mitral valve is normal in structure. Mild mitral valve  regurgitation. No evidence of mitral stenosis.   4. The aortic valve is normal in structure. Aortic valve regurgitation is  not visualized. Mild to moderate aortic valve sclerosis/calcification is  present, without any evidence of aortic stenosis.    Labs/Other Tests and Data Reviewed:    EKG:  EKG 01/28/21 showed NSR, RBBB, LAD, bifasicular block  Recent Labs: 12/26/2020: ALT 15; Magnesium 2.0; TSH 1.571 01/14/2021: Hemoglobin 11.7; Platelets 243 02/04/2021: BUN 40; Creatinine, Ser 1.69; Potassium 4.6; Sodium 140   Recent Lipid Panel Lab Results  Component Value Date/Time   CHOL 216 (H) 12/27/2020 06:30 AM   TRIG 343 (H) 12/27/2020 06:30 AM   HDL 34 (L) 12/27/2020 06:30 AM   CHOLHDL 6.4 12/27/2020 06:30 AM   LDLCALC 113 (H) 12/27/2020  06:30 AM    Wt Readings from Last 3 Encounters:  02/02/21 249 lb 14.4 oz (113.4 kg)  01/28/21 247 lb (112 kg)  01/12/21 252 lb 4 oz (114.4 kg)         Objective:    Vital Signs:  There were no vitals taken for this visit.   No vitals reported by patient  ASSESSMENT & PLAN:    CAD with stable angina Patient reports improved chest pain with increased NTG patch,  however still has some episodes. Heart monitor showed no recurrence of afib. The patient feels a lot of the chest pain occurs in the setting of stress. Recent cath 12/2020 showed 3V CAD with patent stents in the LAD and RCA with minimal restenosis, CTO Lcx with R>L collaterals and L>R collaterals. Once stress situation has resolved plan to reassess symptoms in 3 months. Continue plavix, statin, BB, Ntg patch. No ASA with Eliquis.  HFpEF Patient denies SOB, LLE, pnd. She has chronic orthopnea. Continue lasix 20mg  daily, losartan 50mg  daily and Toprol 50mg  daily.   Palpitations Paroxysmal Afib Recent heart monitor showed no afib. Continue rate control with Toprol. Patient reports she is unable to tolerate higher dose of Toprol. If palpitations return can consider Cardizem. Continue Eliquis 5mg BID for s/c.   HLD Started on Repatha. Needs re-check lipid panel in 1-2 months.   [facility] Does not have CPAP machine. She had the sleep study and plans on following up in the near future.   COVID-19 Education: The signs and symptoms of COVID-19 were discussed with the patient and how to seek care for testing (follow up with PCP or arrange E-visit).  The importance of social distancing was discussed today.  Time:   Today, I have spent 25 minutes with the patient with telehealth technology discussing the above problems.     Medication Adjustments/Labs and Tests Ordered: Current medicines are reviewed at length with the patient today.  Concerns regarding medicines are outlined above.   Tests Ordered: No orders of the defined types were  placed in this encounter.   Medication Changes: No orders of the defined types were placed in this encounter.   Follow Up:  In Person in 3 month(s)  Signed, Collins Dimaria , PA-C  02/26/2021 2:39 PM    [facility]

## 2021-02-26 ENCOUNTER — Telehealth (INDEPENDENT_AMBULATORY_CARE_PROVIDER_SITE_OTHER): Payer: Medicare Other | Admitting: Medical

## 2021-02-26 ENCOUNTER — Ambulatory Visit: Payer: Medicare Other | Admitting: Medical

## 2021-02-26 ENCOUNTER — Other Ambulatory Visit: Payer: Self-pay

## 2021-02-26 DIAGNOSIS — I10 Essential (primary) hypertension: Secondary | ICD-10-CM | POA: Diagnosis not present

## 2021-02-26 DIAGNOSIS — E782 Mixed hyperlipidemia: Secondary | ICD-10-CM | POA: Diagnosis not present

## 2021-02-26 DIAGNOSIS — I5032 Chronic diastolic (congestive) heart failure: Secondary | ICD-10-CM | POA: Diagnosis not present

## 2021-02-26 DIAGNOSIS — I480 Paroxysmal atrial fibrillation: Secondary | ICD-10-CM

## 2021-02-26 DIAGNOSIS — I25118 Atherosclerotic heart disease of native coronary artery with other forms of angina pectoris: Secondary | ICD-10-CM

## 2021-02-26 DIAGNOSIS — G4733 Obstructive sleep apnea (adult) (pediatric): Secondary | ICD-10-CM

## 2021-02-26 NOTE — Patient Instructions (Signed)
Medication Instructions:  - Your physician recommends that you continue on your current medications as directed. Please refer to the Current Medication list given to you today.  *If you need a refill on your cardiac medications before your next appointment, please call your pharmacy*   Lab Work: - none ordered  If you have labs (blood work) drawn today and your tests are completely normal, you will receive your results only by: MyChart Message (if you have MyChart) OR A paper copy in the mail If you have any lab test that is abnormal or we need to change your treatment, we will call you to review the results.   Testing/Procedures: - none ordered   Follow-Up: At [facility], you and your health needs are our priority.  As part of our continuing mission to provide you with exceptional heart care, we have created designated Provider Care Teams.  These Care Teams include your primary Cardiologist (physician) and Advanced Practice Providers (APPs -  Physician Assistants and Nurse Practitioners) who all work together to provide you with the care you need, when you need it.  We recommend signing up for the patient portal called "MyChart".  Sign up information is provided on this After Visit Summary.  MyChart is used to connect with patients for Virtual Visits (Telemedicine).  Patients are able to view lab/test results, encounter notes, upcoming appointments, etc.  Non-urgent messages can be sent to your provider as well.   To learn more about what you can do with MyChart, go to [email or web address].    Your next appointment:   3 month(s)  The format for your next appointment:   In Person  Provider:   You may see Christopher End, MD or one of the following Advanced Practice Providers on your designated Care Team:   Christopher Berge, NP Ryan Dunn, PA-C Jacquelyn Visser, PA-C Cadence Furth, PA-C   Other Instructions N/a  

## 2021-06-04 ENCOUNTER — Telehealth: Payer: Medicare Other | Admitting: Internal Medicine

## 2021-11-27 ENCOUNTER — Other Ambulatory Visit: Payer: Self-pay | Admitting: Internal Medicine

## 2021-11-30 NOTE — Telephone Encounter (Signed)
Please schedule overdue 3 month F/U for refills. Thank you!

## 2023-03-20 IMAGING — DX DG CHEST 1V PORT
1 series · 1 of 1 positions shown · non-contrast
Comparison: No priors.

CLINICAL DATA: 75-year-old female with history of chest tightness
and burning.

EXAM:
PORTABLE CHEST 1 VIEW

[chest ap]
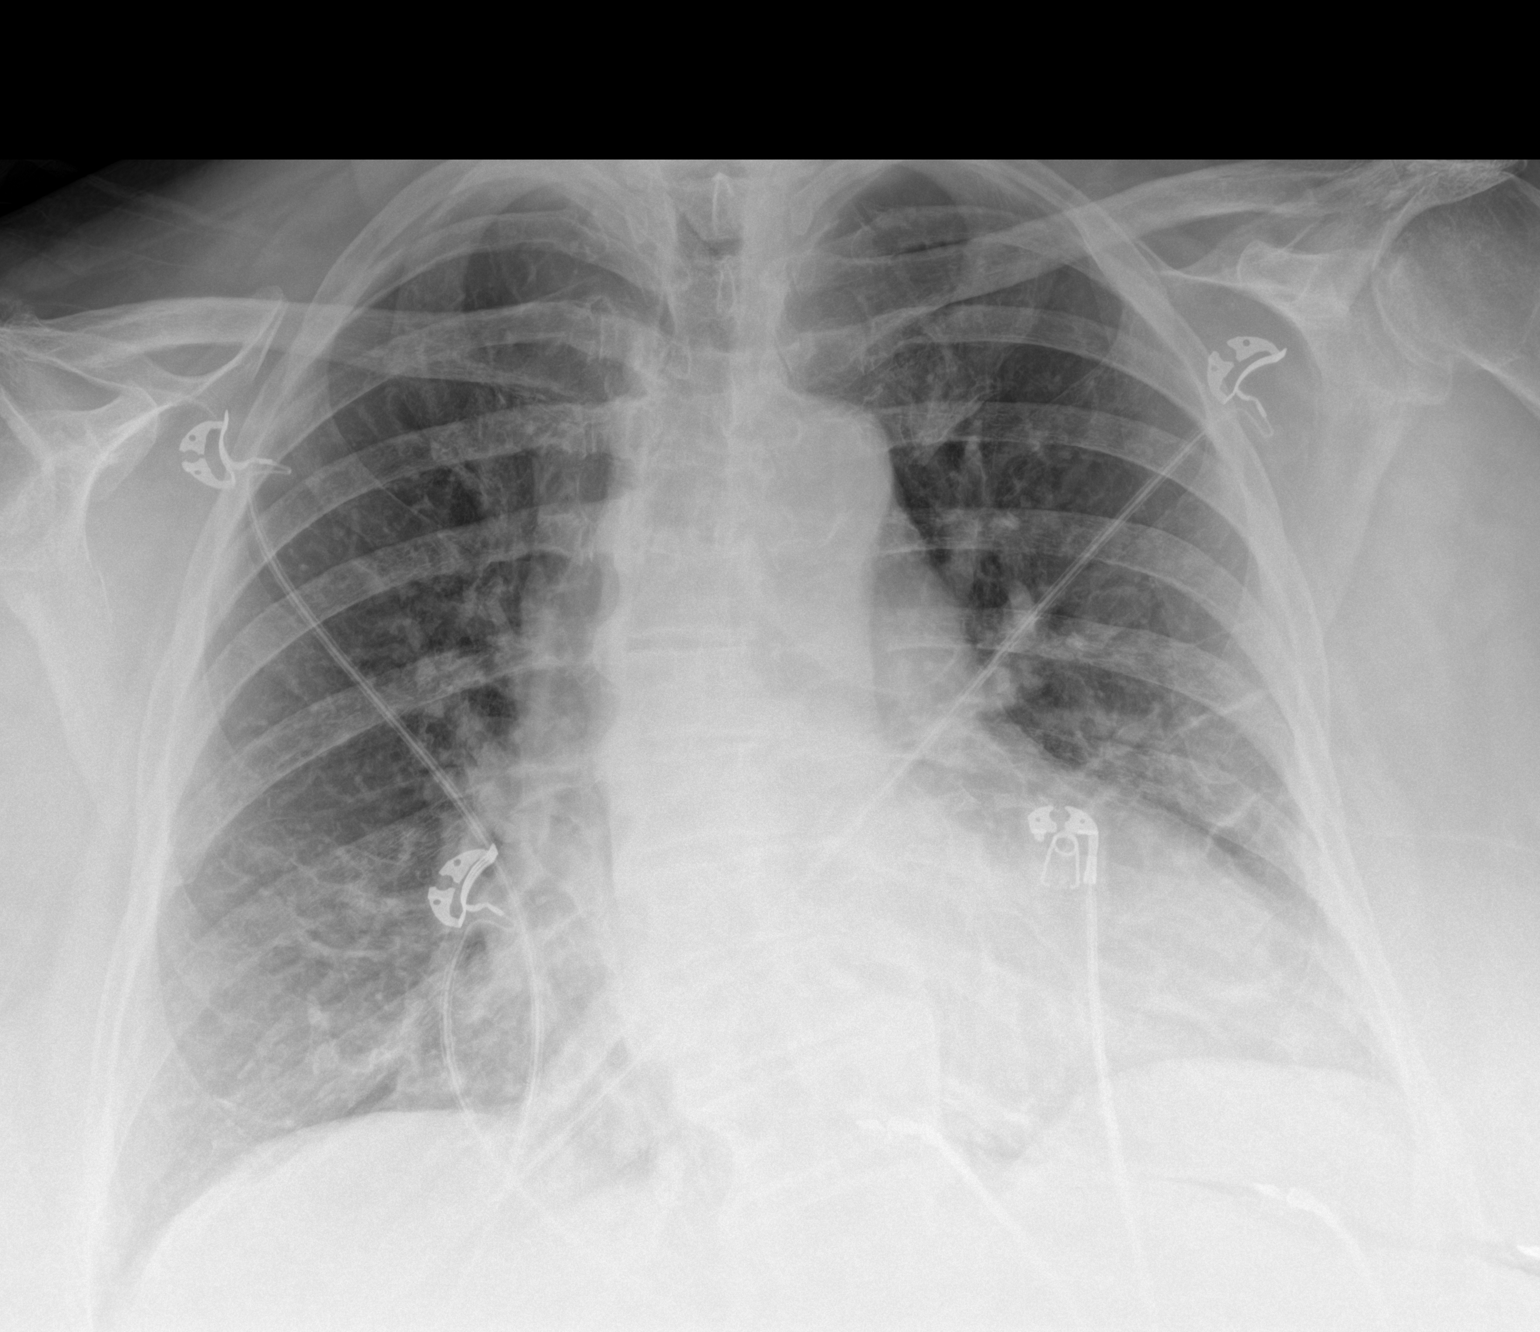

[1 of 1 positions shown; findings below may reference images not displayed]

FINDINGS: Lung volumes are slightly low. Linear areas of scarring or
subsegmental atelectasis are noted throughout the left mid to lower
lung. No acute consolidative airspace disease. No pleural effusions.
No pneumothorax. No evidence of pulmonary edema. Heart size is
borderline enlarged accentuated by low lung volumes and portable AP
technique. Upper mediastinal contours are within normal limits.
Atherosclerotic calcifications in the arch of the aorta.
IMPRESSION: 1. Low lung volumes without definite radiographic evidence of acute
cardiopulmonary disease.
2. Mild linear scarring or subsegmental atelectasis in the left mid
to lower lung.
3. Aortic atherosclerosis.
